# Patient Record
Sex: Male | Born: 1935 | Race: White | Hispanic: No | Marital: Married | Smoking: Never smoker
Health system: Southern US, Community
[De-identification: ages and names within clinical notes are randomized; demographics above are authoritative.]

## PROBLEM LIST (undated history)

## (undated) DIAGNOSIS — E039 Hypothyroidism, unspecified: Secondary | ICD-10-CM

## (undated) DIAGNOSIS — I10 Essential (primary) hypertension: Secondary | ICD-10-CM

## (undated) DIAGNOSIS — K5790 Diverticulosis of intestine, part unspecified, without perforation or abscess without bleeding: Secondary | ICD-10-CM

## (undated) DIAGNOSIS — H409 Unspecified glaucoma: Secondary | ICD-10-CM

## (undated) DIAGNOSIS — M1990 Unspecified osteoarthritis, unspecified site: Secondary | ICD-10-CM

## (undated) HISTORY — DX: Essential (primary) hypertension: I10

## (undated) HISTORY — DX: Diverticulosis of intestine, part unspecified, without perforation or abscess without bleeding: K57.90

## (undated) HISTORY — DX: Hypothyroidism, unspecified: E03.9

## (undated) HISTORY — PX: BACK SURGERY: SHX140

## (undated) HISTORY — DX: Unspecified glaucoma: H40.9

## (undated) HISTORY — DX: Unspecified osteoarthritis, unspecified site: M19.90

---

## 2016-06-13 ENCOUNTER — Telehealth: Payer: Self-pay

## 2016-06-13 NOTE — Telephone Encounter (Signed)
Frank Shipper, MD  Frank Huxley, RN Cc: Frank Wood, CMA        Frank Wood,  Please add on patient Frank Wood date of birth 1932/09/23. Dr. Wilhemina Bonito' father-in-law. From [place]. Reason for the evaluation is dyspepsia with anorexia and weight loss. Have him show up at 11 AM. His contact is his daughter Frank Wood. Please contact her at [phone]. Thank you    Pt scheduled to see Dr. Henrene Pastor 06/14/16'@11'$ :15am. Pts daughter aware of appt date and time.

## 2016-06-14 ENCOUNTER — Encounter (INDEPENDENT_AMBULATORY_CARE_PROVIDER_SITE_OTHER): Payer: Self-pay

## 2016-06-14 ENCOUNTER — Other Ambulatory Visit (INDEPENDENT_AMBULATORY_CARE_PROVIDER_SITE_OTHER): Payer: Medicare Other

## 2016-06-14 ENCOUNTER — Ambulatory Visit (INDEPENDENT_AMBULATORY_CARE_PROVIDER_SITE_OTHER): Payer: Medicare Other | Admitting: Internal Medicine

## 2016-06-14 ENCOUNTER — Encounter: Payer: Self-pay | Admitting: Internal Medicine

## 2016-06-14 VITALS — BP 130/78 | HR 80 | Ht 71.0 in | Wt 176.0 lb

## 2016-06-14 DIAGNOSIS — R140 Abdominal distension (gaseous): Secondary | ICD-10-CM

## 2016-06-14 DIAGNOSIS — R634 Abnormal weight loss: Secondary | ICD-10-CM

## 2016-06-14 DIAGNOSIS — R630 Anorexia: Secondary | ICD-10-CM | POA: Diagnosis not present

## 2016-06-14 DIAGNOSIS — R109 Unspecified abdominal pain: Secondary | ICD-10-CM | POA: Diagnosis not present

## 2016-06-14 LAB — BASIC METABOLIC PANEL
BUN: 28 mg/dL — ABNORMAL HIGH (ref 6–23)
CHLORIDE: 102 meq/L (ref 96–112)
CO2: 27 meq/L (ref 19–32)
CREATININE: 1.1 mg/dL (ref 0.40–1.50)
Calcium: 9.9 mg/dL (ref 8.4–10.5)
GFR: 68.16 mL/min (ref >60.00)
Glucose, Bld: 103 mg/dL — ABNORMAL HIGH (ref 70–99)
POTASSIUM: 3.9 meq/L (ref 3.5–5.1)
SODIUM: 140 meq/L (ref 135–145)

## 2016-06-14 LAB — CBC WITH DIFFERENTIAL/PLATELET
BASOS PCT: 0.4 % (ref 0.0–3.0)
Basophils Absolute: 0 10*3/uL (ref 0.0–0.1)
EOS ABS: 0.1 10*3/uL (ref 0.0–0.7)
EOS PCT: 1.1 % (ref 0.0–5.0)
HEMATOCRIT: 41.4 % (ref 39.0–52.0)
HEMOGLOBIN: 14 g/dL (ref 13.0–17.0)
LYMPHS PCT: 26.9 % (ref 12.0–46.0)
Lymphs Abs: 2 10*3/uL (ref 0.7–4.0)
MCHC: 33.7 g/dL (ref 30.0–36.0)
MCV: 87.7 fl (ref 78.0–100.0)
MONOS PCT: 7.6 % (ref 3.0–12.0)
Monocytes Absolute: 0.6 10*3/uL (ref 0.1–1.0)
Neutro Abs: 4.7 10*3/uL (ref 1.4–7.7)
Neutrophils Relative %: 64 % (ref 43.0–77.0)
Platelets: 240 10*3/uL (ref 150.0–400.0)
RBC: 4.72 Mil/uL (ref 4.22–5.81)
RDW: 13.6 % (ref 11.5–15.5)
WBC: 7.3 10*3/uL (ref 4.0–10.5)

## 2016-06-14 MED ORDER — NA SULFATE-K SULFATE-MG SULF 17.5-3.13-1.6 GM/177ML PO SOLN: 1.0000 | Freq: Once | ORAL | 0 refills | Status: AC

## 2016-06-14 NOTE — Progress Notes (Signed)
HISTORY OF PRESENT ILLNESS:  Frank Wood is a 82 y.o. male , [place] native and father of Frank Wood, who is self-referred with chief complaints of abdominal bloating, fatigue, decreased appetite, and weight loss. He is accompanied by his wife and daughter Frank Wood. The patient reports being in his usual state of health until approximately 1 month ago when he began to notice borborygmi and bloating postprandially. He also reported increased intestinal gas. Associated with this was nausea without vomiting, decreased appetite, and 12 pound weight loss. He denies pyrosis, dysphagia, true abdominal pain, or change in bowel habits. Last colonoscopy approximately 10 years ago with diverticulosis. He does take meloxicam on a regular basis and has done so for several years. He has been anxious and has chronic insomnia. He is concerned about how he is feeling. He does have hypothyroidism and is on replacement. They tell me that his thyroid studies about 3 weeks ago were normal. His PCP was recommended GI evaluation but this could not be scheduled until December. He presents now. Patient did have a significant acute diarrheal illness in June that required urgent care evaluation and hydration. Week or so the fill well then he returned to his baseline. Last week his PCP placed him on pantoprazole 40 mg daily with no change in how he has felt. He was started on Lexapro 10 mg daily just yesterday. No family history of gastrointestinal malignancy. His only surgery was back surgery proximal me 4 years ago  REVIEW OF SYSTEMS:  All non-GI ROS negative except for arthritis, back pain, hearing problems, sleeping problems  Past Medical History:  Diagnosis Date  . Arthritis   . Diverticulosis   . Glaucoma   . Hypertension   . Hypothyroidism     Past Surgical History:  Procedure Laterality Date  . BACK SURGERY      Social History Frank Wood  reports that he has never smoked. He has never used  smokeless tobacco. He reports that he does not drink alcohol or use drugs.  family history includes Diabetes in his mother and sister; Prostate cancer in his brother.  No Known Allergies     PHYSICAL EXAMINATION: Vital signs: BP 130/78 (BP Location: Right Arm, Patient Position: Sitting, Cuff Size: Normal)   Pulse 80   Ht '5\' 11"'$  (1.803 m)   Wt 176 lb (79.8 kg)   BMI 24.55 kg/m   Constitutional: Pleasant, generally well-appearing, no acute distress Psychiatric: alert and oriented x3, cooperative. Somewhat worried appearing Eyes: extraocular movements intact, anicteric, conjunctiva pink Mouth: oral pharynx moist, no lesions Neck: supple without thyromegaly Lymph: no lymphadenopathy Cardiovascular: heart regular rate and rhythm, no murmur Lungs: clear to auscultation bilaterally Abdomen: soft, nontender, nondistended, no obvious ascites, no peritoneal signs, normal bowel sounds, no organomegaly Rectal: Deferred until colonoscopy Extremities: no clubbing cyanosis or lower extremity edema bilaterally Skin: no lesions on visible extremities Neuro: No focal deficits. Normal DTRs. No asterixis.  ASSESSMENT:  #76. 82 year old presents with 1 month history of dyspepsia, abdominal bloating discomfort, fatigue, decreased appetite, and 12 pound weight loss. Possible etiologies include organic entities such as pancreatic cancer, gallbladder disease, ulcer disease, or occult malignancy otherwise. As well, motility disturbance. Finally, functional entities such as depression in the elderly.  PLAN:  #1. Laboratories today including CBC and comprehensive metabolic panel. At the request we will contact Frank Wood with the results. [phone] #2. Contrast-enhanced CT scan of the abdomen and pelvis to evaluate abdominal discomfort and weight loss #3. Continue PPI #4. Continue  Lexapro #5. Schedule colonoscopy and upper endoscopy to evaluate abdominal complaints and weight loss.The nature of the  procedure, as well as the risks, benefits, and alternatives were carefully and thoroughly reviewed with the patient. Ample time for discussion and questions allowed. The patient understood, was satisfied, and agreed to proceed.

## 2016-06-14 NOTE — Patient Instructions (Addendum)
Your physician has requested that you go to the basement for the following lab work before leaving today:  BMET, CBC  You have been scheduled for a CT scan of the abdomen and pelvis at [facility] ([address]---this is in the same building as [facility]).   You are scheduled on 06/16/2016 at 10:30am. You should arrive 15 minutes prior to your appointment time for registration. Please follow the written instructions below on the day of your exam:  WARNING: IF YOU ARE ALLERGIC TO IODINE/X-RAY DYE, PLEASE NOTIFY RADIOLOGY IMMEDIATELY AT [phone]! YOU WILL BE GIVEN A 13 HOUR PREMEDICATION PREP.  1) Do not eat or drink anything after 6:30am (4 hours prior to your test) 2) You have been given 2 bottles of oral contrast to drink. The solution may taste               better if refrigerated, but do NOT add ice or any other liquid to this solution. Shake             well before drinking.    Drink 1 bottle of contrast @ 8:30am (2 hours prior to your exam)  Drink 1 bottle of contrast @ 9:30am (1 hour prior to your exam)  You may take any medications as prescribed with a small amount of water except for the following: Metformin, Glucophage, Glucovance, Avandamet, Riomet, Fortamet, Actoplus Met, Janumet, Glumetza or Metaglip. The above medications must be held the day of the exam AND 48 hours after the exam.  The purpose of you drinking the oral contrast is to aid in the visualization of your intestinal tract. The contrast solution may cause some diarrhea. Before your exam is started, you will be given a small amount of fluid to drink. Depending on your individual set of symptoms, you may also receive an intravenous injection of x-ray contrast/dye. Plan on being at [facility] for 30 minutes or long, depending on the type of exam you are having performed.  If you have any questions regarding your exam or if you need to reschedule, you may call the CT department at [phone]  between the hours of 8:00 am and 5:00 pm, Monday-Friday.   You have been scheduled for an endoscopy and colonoscopy. Please follow the written instructions given to you at your visit today. Please pick up your prep supplies at the pharmacy within the next 1-3 days. If you use inhalers (even only as needed), please bring them with you on the day of your procedure. Your physician has requested that you go to www.startemmi.com and enter the access code given to you at your visit today. This web site gives a general overview about your procedure. However, you should still follow specific instructions given to you by our office regarding your preparation for the procedure.  ________________________________________________________________________

## 2016-06-16 ENCOUNTER — Encounter (HOSPITAL_COMMUNITY): Payer: Self-pay

## 2016-06-16 ENCOUNTER — Ambulatory Visit (HOSPITAL_COMMUNITY)
Admission: RE | Admit: 2016-06-16 | Discharge: 2016-06-16 | Disposition: A | Discharge status: Home / Self Care | Payer: Medicare Other | Source: Ambulatory Visit | Attending: Internal Medicine | Admitting: Internal Medicine

## 2016-06-16 DIAGNOSIS — R634 Abnormal weight loss: Secondary | ICD-10-CM | POA: Diagnosis not present

## 2016-06-16 DIAGNOSIS — K5730 Diverticulosis of large intestine without perforation or abscess without bleeding: Secondary | ICD-10-CM | POA: Insufficient documentation

## 2016-06-16 DIAGNOSIS — N281 Cyst of kidney, acquired: Secondary | ICD-10-CM | POA: Diagnosis not present

## 2016-06-16 DIAGNOSIS — I700 Atherosclerosis of aorta: Secondary | ICD-10-CM | POA: Diagnosis not present

## 2016-06-16 DIAGNOSIS — R140 Abdominal distension (gaseous): Secondary | ICD-10-CM | POA: Diagnosis not present

## 2016-06-16 MED ORDER — IOPAMIDOL (ISOVUE-300) INJECTION 61%
100.0000 mL | Freq: Once | INTRAVENOUS | Status: AC | PRN
  Administered 2016-06-16: 100 mL via INTRAVENOUS

## 2016-06-19 ENCOUNTER — Ambulatory Visit (AMBULATORY_SURGERY_CENTER): Payer: Medicare Other | Admitting: Internal Medicine

## 2016-06-19 ENCOUNTER — Encounter: Payer: Self-pay | Admitting: Internal Medicine

## 2016-06-19 VITALS — BP 131/53 | HR 61 | Temp 98.6°F | Resp 17 | Ht 71.0 in | Wt 176.0 lb

## 2016-06-19 DIAGNOSIS — K635 Polyp of colon: Secondary | ICD-10-CM

## 2016-06-19 DIAGNOSIS — R634 Abnormal weight loss: Secondary | ICD-10-CM | POA: Diagnosis not present

## 2016-06-19 DIAGNOSIS — Z1211 Encounter for screening for malignant neoplasm of colon: Secondary | ICD-10-CM | POA: Diagnosis not present

## 2016-06-19 DIAGNOSIS — R630 Anorexia: Secondary | ICD-10-CM | POA: Diagnosis not present

## 2016-06-19 DIAGNOSIS — R140 Abdominal distension (gaseous): Secondary | ICD-10-CM

## 2016-06-19 DIAGNOSIS — Z1212 Encounter for screening for malignant neoplasm of rectum: Secondary | ICD-10-CM

## 2016-06-19 DIAGNOSIS — D125 Benign neoplasm of sigmoid colon: Secondary | ICD-10-CM

## 2016-06-19 MED ORDER — SODIUM CHLORIDE 0.9 % IV SOLN: 500.0000 mL | INTRAVENOUS | Status: DC

## 2016-06-19 NOTE — Op Note (Signed)
[facility] Patient Name: Frank Wood Procedure Date: 06/19/2016 11:06 AM MRN: [id number] Endoscopist: Docia Chuck. Henrene Pastor , MD Age: 82 Referring MD:  Date of Birth: 1933/01/19 Gender: Male Account #: [id number] Procedure:                Upper GI endoscopy Indications:              Anorexia, Early satiety, Weight loss Medicines:                Monitored Anesthesia Care Procedure:                Pre-Anesthesia Assessment:                           - Prior to the procedure, a History and Physical                            was performed, and patient medications and                            allergies were reviewed. The patient's tolerance of                            previous anesthesia was also reviewed. The risks                            and benefits of the procedure and the sedation                            options and risks were discussed with the patient.                            All questions were answered, and informed consent                            was obtained. Prior Anticoagulants: The patient has                            taken no previous anticoagulant or antiplatelet                            agents. ASA Grade Assessment: II - A patient with                            mild systemic disease. After reviewing the risks                            and benefits, the patient was deemed in                            satisfactory condition to undergo the procedure.                           After obtaining informed consent, the endoscope was  passed under direct vision. Throughout the                            procedure, the patient's blood pressure, pulse, and                            oxygen saturations were monitored continuously. The                            Model GIF-[id number]) scope was introduced                            through the mouth, and advanced to the second part                            of duodenum. The  upper GI endoscopy was                            accomplished without difficulty. The patient                            tolerated the procedure well. Scope In: Scope Out: Findings:                 The esophagus was normal.                           The stomach was normal.                           The examined duodenum was normal.                           The cardia and gastric fundus were normal on                            retroflexion. Complications:            No immediate complications. Estimated Blood Loss:     Estimated blood loss: none. Impression:               - Normal EGD                           - Suspect GI symptoms are secondary to                            anxiety/depression. Recommendation:           - Continue to work with your primary care provider                            regarding treatment of anxiety/depression. As this                            improves, I expect your GI symptoms to resolve.                           -  Resume previous diet.                           - Continue present medications. Docia Chuck. Henrene Pastor, MD 06/19/2016 11:41:39 AM This report has been signed electronically.

## 2016-06-19 NOTE — Op Note (Signed)
[facility] Patient Name: Frank Wood Procedure Date: 06/19/2016 11:06 AM MRN: [id number] Endoscopist: Docia Chuck. Henrene Pastor , MD Age: 82 Referring MD:  Date of Birth: 02/12/1933 Gender: Male Account #: [id number] Procedure:                Colonoscopy, with cold snare polypectomy x 1 Indications:              Screening for colorectal malignant neoplasm Medicines:                Monitored Anesthesia Care Procedure:                Pre-Anesthesia Assessment:                           - Prior to the procedure, a History and Physical                            was performed, and patient medications and                            allergies were reviewed. The patient's tolerance of                            previous anesthesia was also reviewed. The risks                            and benefits of the procedure and the sedation                            options and risks were discussed with the patient.                            All questions were answered, and informed consent                            was obtained. Prior Anticoagulants: The patient has                            taken no previous anticoagulant or antiplatelet                            agents. ASA Grade Assessment: II - A patient with                            mild systemic disease. After reviewing the risks                            and benefits, the patient was deemed in                            satisfactory condition to undergo the procedure.                           After obtaining informed consent, the colonoscope  was passed under direct vision. Throughout the                            procedure, the patient's blood pressure, pulse, and                            oxygen saturations were monitored continuously. The                            Model CF-[id number]) scope was introduced                            through the anus and advanced to the the cecum,              identified by appendiceal orifice and ileocecal                            valve. The ileocecal valve, appendiceal orifice,                            and rectum were photographed. The quality of the                            bowel preparation was excellent. The colonoscopy                            was performed without difficulty. The patient                            tolerated the procedure well. The bowel preparation                            used was SUPREP. Scope In: 11:16:43 AM Scope Out: 11:27:48 AM Scope Withdrawal Time: 0 hours 9 minutes 19 seconds  Total Procedure Duration: 0 hours 11 minutes 5 seconds  Findings:                 A 5 mm polyp was found in the sigmoid colon. The                            polyp was removed with a cold snare. Resection and                            retrieval were complete.                           Multiple diverticula were found in the left colon                            and right colon.                           The exam was otherwise without abnormality on  direct and retroflexion views. Complications:            No immediate complications. Estimated blood loss:                            None. Estimated Blood Loss:     Estimated blood loss: none. Impression:               - One 5 mm polyp in the sigmoid colon, removed with                            a cold snare. Resected and retrieved.                           - Diverticulosis in the left colon and in the right                            colon.                           - The examination was otherwise normal on direct                            and retroflexion views. Recommendation:           - Repeat colonoscopy is not recommended for                            surveillance, given the favorable findings and your                            current age.                           - EGD today. Please see report for findings and                             final recommendations.                           - Await pathology results. Docia Chuck. Henrene Pastor, MD 06/19/2016 11:38:19 AM This report has been signed electronically.

## 2016-06-19 NOTE — Progress Notes (Signed)
Called to room to assist during endoscopic procedure.  Patient ID and intended procedure confirmed with present staff. Received instructions for my participation in the procedure from the performing physician.  

## 2016-06-19 NOTE — Progress Notes (Signed)
Report given to PACU RN, vss 

## 2016-06-19 NOTE — Patient Instructions (Signed)
Discharge instructions given. Handouts on polyps and diverticulosis. Resume previous medications. YOU HAD AN ENDOSCOPIC PROCEDURE TODAY AT [facility]:   Refer to the procedure report that was given to you for any specific questions about what was found during the examination.  If the procedure report does not answer your questions, please call your gastroenterologist to clarify.  If you requested that your care partner not be given the details of your procedure findings, then the procedure report has been included in a sealed envelope for you to review at your convenience later.  YOU SHOULD EXPECT: Some feelings of bloating in the abdomen. Passage of more gas than usual.  Walking can help get rid of the air that was put into your GI tract during the procedure and reduce the bloating. If you had a lower endoscopy (such as a colonoscopy or flexible sigmoidoscopy) you may notice spotting of blood in your stool or on the toilet paper. If you underwent a bowel prep for your procedure, you may not have a normal bowel movement for a few days.  Please Note:  You might notice some irritation and congestion in your nose or some drainage.  This is from the oxygen used during your procedure.  There is no need for concern and it should clear up in a day or so.  SYMPTOMS TO REPORT IMMEDIATELY:   Following lower endoscopy (colonoscopy or flexible sigmoidoscopy):  Excessive amounts of blood in the stool  Significant tenderness or worsening of abdominal pains  Swelling of the abdomen that is new, acute  Fever of 100F or higher   Following upper endoscopy (EGD)  Vomiting of blood or coffee ground material  New chest pain or pain under the shoulder blades  Painful or persistently difficult swallowing  New shortness of breath  Fever of 100F or higher  Black, tarry-looking stools  For urgent or emergent issues, a gastroenterologist can be reached at any hour by calling [phone]  [phone].   DIET:  We do recommend a small meal at first, but then you may proceed to your regular diet.  Drink plenty of fluids but you should avoid alcoholic beverages for 24 hours.  ACTIVITY:  You should plan to take it easy for the rest of today and you should NOT DRIVE or use heavy machinery until tomorrow (because of the sedation medicines used during the test).    FOLLOW UP: Our staff will call the number listed on your records the next business day following your procedure to check on you and address any questions or concerns that you may have regarding the information given to you following your procedure. If we do not reach you, we will leave a message.  However, if you are feeling well and you are not experiencing any problems, there is no need to return our call.  We will assume that you have returned to your regular daily activities without incident.  If any biopsies were taken you will be contacted by phone or by letter within the next 1-3 weeks.  Please call us at [phone] if you have not heard about the biopsies in 3 weeks.    SIGNATURES/CONFIDENTIALITY: You and/or your care partner have signed paperwork which will be entered into your electronic medical record.  These signatures attest to the fact that that the information above on your After Visit Summary has been reviewed and is understood.  Full responsibility of the confidentiality of this discharge information lies with you and/or your care-partner.

## 2016-06-20 ENCOUNTER — Telehealth: Payer: Self-pay

## 2016-06-20 ENCOUNTER — Telehealth: Payer: Self-pay | Admitting: *Deleted

## 2016-06-20 NOTE — Telephone Encounter (Signed)
  Follow up Call-  Call back number 06/19/2016  Post procedure Call Back phone  # [phone]  Permission to leave phone message Yes     Patient questions:  Do you have a fever, pain , or abdominal swelling? No. Pain Score  0 *  Have you tolerated food without any problems? Yes.    Have you been able to return to your normal activities? Yes.    Do you have any questions about your discharge instructions: Diet   No. Medications  No. Follow up visit  No.  Do you have questions or concerns about your Care? No.  Actions: * If pain score is 4 or above: No action needed, pain <4.

## 2016-06-20 NOTE — Telephone Encounter (Signed)
  Follow up Call-  Call back number 06/19/2016  Post procedure Call Back phone  # [phone]  Permission to leave phone message Yes     Patient questions:  Message left to call us if necessary.

## 2016-06-22 ENCOUNTER — Encounter: Payer: Self-pay | Admitting: Internal Medicine

## 2016-08-25 ENCOUNTER — Telehealth: Payer: Self-pay | Admitting: Internal Medicine

## 2016-09-05 NOTE — Telephone Encounter (Signed)
lm

## 2016-09-07 ENCOUNTER — Telehealth: Payer: Self-pay | Admitting: Internal Medicine

## 2016-09-20 NOTE — Telephone Encounter (Signed)
Faxed primary diagnosis for office visit 06/14/2016 of Anorexia/weight loss  ICD-10-DM R63.4

## 2017-12-21 ENCOUNTER — Telehealth: Payer: Self-pay | Admitting: *Deleted

## 2017-12-21 ENCOUNTER — Encounter: Payer: Self-pay | Admitting: Radiation Oncology

## 2017-12-21 DIAGNOSIS — R918 Other nonspecific abnormal finding of lung field: Secondary | ICD-10-CM

## 2017-12-21 NOTE — Telephone Encounter (Signed)
Oncology Nurse Navigator Documentation  Oncology Nurse Navigator Flowsheets 12/21/2017  Navigator Location [facility]  Navigator Encounter Type Telephone/I called patient's PCP in [place] and his office is closed on Friday's.  I called patient's wife back. I instructed her to obtain CD of patient's most recent scans. She verbalized understanding and will do this on Monday.   Treatment Phase Pre-Tx/Tx Discussion  Barriers/Navigation Needs Education  Education Other  Interventions Education  Education Method Verbal  Acuity Level 2  Time Spent with Patient 30

## 2017-12-21 NOTE — Progress Notes (Signed)
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 The patient's son in law approached me about getting a second opinion here at [facility] and potentially getting care here.  I suggested he send me the records and I will coordinate [facility] evaluation/visit.

## 2017-12-21 NOTE — Telephone Encounter (Signed)
Oncology Nurse Navigator Documentation  Oncology Nurse Navigator Flowsheets 12/21/2017  Navigator Location [facility]  Referral date to RadOnc/MedOnc 12/21/2017  Navigator Encounter Type Telephone;Other/I received referral from Dr. Tammi Klippel today.  I called patient and spoke with wife. I gave her an appt to be seen at [facility] next week.  I also obtained records from Dr. Johny Shears office.  I will call [place] to obtain scan and pathology.    Telephone Outgoing Call  Treatment Phase Pre-Tx/Tx Discussion  Barriers/Navigation Needs Coordination of Care  Interventions Coordination of Care  Coordination of Care Appts;Other  Acuity Level 2  Time Spent with Patient 45

## 2017-12-24 ENCOUNTER — Encounter: Payer: Self-pay | Admitting: *Deleted

## 2017-12-24 NOTE — Progress Notes (Signed)
Oncology Nurse Navigator Documentation  Oncology Nurse Navigator Flowsheets 12/24/2017  Navigator Location [facility]  Navigator Encounter Type Telephone/I called patient's PCP Dr. Azzie Almas in [place] to obtain records.  I was updated that their fax machine is not working but it is being worked on. If they are unable to fax today, they will give records to family this Tuesday.    Telephone Outgoing Call  Treatment Phase Pre-Tx/Tx Discussion  Barriers/Navigation Needs Coordination of Care  Interventions Coordination of Care  Coordination of Care Other  Acuity Level 1  Time Spent with Patient 30

## 2017-12-26 ENCOUNTER — Telehealth: Payer: Self-pay | Admitting: *Deleted

## 2017-12-26 ENCOUNTER — Ambulatory Visit
Admission: RE | Admit: 2017-12-26 | Discharge: 2017-12-26 | Disposition: A | Discharge status: Home / Self Care | Payer: Self-pay | Source: Ambulatory Visit | Attending: Radiation Oncology | Admitting: Radiation Oncology

## 2017-12-26 ENCOUNTER — Other Ambulatory Visit: Payer: Self-pay | Admitting: Radiation Oncology

## 2017-12-26 ENCOUNTER — Telehealth: Payer: Self-pay | Admitting: Radiation Oncology

## 2017-12-26 DIAGNOSIS — C3490 Malignant neoplasm of unspecified part of unspecified bronchus or lung: Secondary | ICD-10-CM

## 2017-12-26 NOTE — Telephone Encounter (Signed)
I spoke with the patient's daughter to discuss the findings thusfar and need for additional work up. We will proceed with MRI brain and PET scan orders and discuss in detail the plan to proceed with treament tomorrow at lung cancer clinic.

## 2017-12-26 NOTE — Addendum Note (Signed)
Addended by: Carola Rhine on: 12/26/2017 11:53 AM   Modules accepted: Orders

## 2017-12-26 NOTE — Telephone Encounter (Signed)
Called patient to inform of Pet and MRI on 01-03-18 @ [facility], lvm for a return call

## 2017-12-27 ENCOUNTER — Other Ambulatory Visit: Payer: Self-pay

## 2017-12-27 ENCOUNTER — Ambulatory Visit: Payer: Medicare Other | Attending: Internal Medicine | Admitting: Physical Therapy

## 2017-12-27 ENCOUNTER — Encounter: Payer: Self-pay | Admitting: *Deleted

## 2017-12-27 ENCOUNTER — Telehealth: Payer: Self-pay | Admitting: Internal Medicine

## 2017-12-27 ENCOUNTER — Inpatient Hospital Stay: Payer: Medicare Other

## 2017-12-27 ENCOUNTER — Ambulatory Visit
Admission: RE | Admit: 2017-12-27 | Discharge: 2017-12-27 | Disposition: A | Discharge status: Home / Self Care | Payer: Medicare Other | Source: Ambulatory Visit | Attending: Radiation Oncology | Admitting: Radiation Oncology

## 2017-12-27 ENCOUNTER — Inpatient Hospital Stay: Payer: Medicare Other | Attending: Internal Medicine | Admitting: Internal Medicine

## 2017-12-27 DIAGNOSIS — R918 Other nonspecific abnormal finding of lung field: Secondary | ICD-10-CM

## 2017-12-27 DIAGNOSIS — R2681 Unsteadiness on feet: Secondary | ICD-10-CM | POA: Insufficient documentation

## 2017-12-27 DIAGNOSIS — Z8042 Family history of malignant neoplasm of prostate: Secondary | ICD-10-CM | POA: Diagnosis not present

## 2017-12-27 DIAGNOSIS — C3410 Malignant neoplasm of upper lobe, unspecified bronchus or lung: Secondary | ICD-10-CM | POA: Insufficient documentation

## 2017-12-27 DIAGNOSIS — R29898 Other symptoms and signs involving the musculoskeletal system: Secondary | ICD-10-CM | POA: Diagnosis present

## 2017-12-27 DIAGNOSIS — Z800 Family history of malignant neoplasm of digestive organs: Secondary | ICD-10-CM | POA: Insufficient documentation

## 2017-12-27 DIAGNOSIS — I10 Essential (primary) hypertension: Secondary | ICD-10-CM | POA: Insufficient documentation

## 2017-12-27 DIAGNOSIS — H409 Unspecified glaucoma: Secondary | ICD-10-CM | POA: Diagnosis not present

## 2017-12-27 DIAGNOSIS — C3412 Malignant neoplasm of upper lobe, left bronchus or lung: Secondary | ICD-10-CM

## 2017-12-27 DIAGNOSIS — C3492 Malignant neoplasm of unspecified part of left bronchus or lung: Secondary | ICD-10-CM

## 2017-12-27 DIAGNOSIS — R2689 Other abnormalities of gait and mobility: Secondary | ICD-10-CM | POA: Diagnosis present

## 2017-12-27 DIAGNOSIS — E039 Hypothyroidism, unspecified: Secondary | ICD-10-CM | POA: Diagnosis not present

## 2017-12-27 DIAGNOSIS — Z5111 Encounter for antineoplastic chemotherapy: Secondary | ICD-10-CM | POA: Insufficient documentation

## 2017-12-27 DIAGNOSIS — Z79899 Other long term (current) drug therapy: Secondary | ICD-10-CM | POA: Insufficient documentation

## 2017-12-27 DIAGNOSIS — M1990 Unspecified osteoarthritis, unspecified site: Secondary | ICD-10-CM | POA: Diagnosis not present

## 2017-12-27 DIAGNOSIS — Z7189 Other specified counseling: Secondary | ICD-10-CM | POA: Insufficient documentation

## 2017-12-27 LAB — CBC WITH DIFFERENTIAL (CANCER CENTER ONLY)
Basophils Absolute: 0 10*3/uL (ref 0.0–0.1)
Basophils Relative: 0 %
Eosinophils Absolute: 0.1 10*3/uL (ref 0.0–0.5)
Eosinophils Relative: 1 %
HEMATOCRIT: 37.8 % — AB (ref 38.4–49.9)
HEMOGLOBIN: 12.2 g/dL — AB (ref 13.0–17.1)
LYMPHS ABS: 1.6 10*3/uL (ref 0.9–3.3)
Lymphocytes Relative: 21 %
MCH: 28.9 pg (ref 27.2–33.4)
MCHC: 32.4 g/dL (ref 32.0–36.0)
MCV: 89.3 fL (ref 79.3–98.0)
Monocytes Absolute: 0.6 10*3/uL (ref 0.1–0.9)
Monocytes Relative: 8 %
NEUTROS ABS: 5.6 10*3/uL (ref 1.5–6.5)
NEUTROS PCT: 70 %
Platelet Count: 272 10*3/uL (ref 140–400)
RBC: 4.23 MIL/uL (ref 4.20–5.82)
RDW: 13.5 % (ref 11.0–14.6)
WBC Count: 7.9 10*3/uL (ref 4.0–10.3)

## 2017-12-27 LAB — CMP (CANCER CENTER ONLY)
ALT: 13 U/L (ref 0–55)
ANION GAP: 8 (ref 3–11)
AST: 15 U/L (ref 5–34)
Albumin: 3.8 g/dL (ref 3.5–5.0)
Alkaline Phosphatase: 81 U/L (ref 40–150)
BUN: 18 mg/dL (ref 7–26)
CHLORIDE: 107 mmol/L (ref 98–109)
CO2: 24 mmol/L (ref 22–29)
CREATININE: 1.09 mg/dL (ref 0.70–1.30)
Calcium: 9.4 mg/dL (ref 8.4–10.4)
GFR, Estimated: >60 mL/min (ref >60)
Glucose, Bld: 132 mg/dL (ref 70–140)
Potassium: 4.6 mmol/L (ref 3.5–5.1)
SODIUM: 139 mmol/L (ref 136–145)
Total Bilirubin: 0.4 mg/dL (ref 0.2–1.2)
Total Protein: 7.7 g/dL (ref 6.4–8.3)

## 2017-12-27 MED ORDER — PROCHLORPERAZINE MALEATE 10 MG PO TABS: 10.0000 mg | ORAL_TABLET | Freq: Four times a day (QID) | ORAL | 0 refills | Status: DC | PRN

## 2017-12-27 NOTE — Progress Notes (Signed)
[facility] [place] Clinical Social Work  Clinical Social Work met with patient/family at [employer] appointment to offer support and assess for psychosocial needs.  Patient was accompanied by his spouse and two daughters.  Patient lives in [place], but plans to stay with his daughter through treatment.  Mr. Lyerly has no concerns at this time, patient's family stated he is coping well and generally manages stress well.    Clinical Social Work briefly discussed Clinical Social Work role and [employer] support programs/services.  Clinical Social Work encouraged patient to call with any additional questions or concerns.   Maryjean Morn, MSW, LCSW, OSW-C Clinical Social Worker [facility] [phone]

## 2017-12-27 NOTE — Progress Notes (Signed)
START ON PATHWAY REGIMEN - Non-Small Cell Lung     Administer weekly:     Paclitaxel      Carboplatin   **Always confirm dose/schedule in your pharmacy ordering system**    Patient Characteristics: Stage III - Unresectable, PS = 0, 1 AJCC T Category: T2b Current Disease Status: No Distant Mets or Local Recurrence AJCC N Category: N2 AJCC M Category: M0 AJCC 8 Stage Grouping: IIIA Performance Status: PS = 0, 1 Intent of Therapy: Curative Intent, Discussed with Patient

## 2017-12-27 NOTE — Progress Notes (Signed)
Radiation Oncology         [phone]) [phone] ________________________________  Initial outpatient Consultation  Name: Frank Wood [facility] MRN: [id number]  Date of Service: 12/27/2017 DOB: [birth year]  CN:[id number], Pcp Not In  Frank Sensing, MD   REFERRING PHYSICIAN: Danella Sensing, MD  DIAGNOSIS: 84 y.o. gentleman with stage IIIA, cT2bN2Mx, NSCLC,squamous cell carcinoma of the left upper lobe, pending PET and brain MRI    ICD-10-CM   1. 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 male seen at the request of the patient for a new diagnosis of squamous cell carcinoma of the lung. The patient was seen in [place], [place] where he resides by Dr. Micael Hampshire in pulmonary medicine after a CXR revealed a mass in the LUL. lung on 11/20/17. He does not have CT imaging, but did undergo navigational bronchoscopy on 12/12/17 with Dr. Mia Creek in [place] [facility]. Biopsies of the left upper lobe revealed a squamous cell carcinoma, at 4L node there were a few lymphocytes, and 4R, he has scattered lymphocytes, in the lavage there were no malignant cells, but there were cells consistent with squamous cell carcinoma in the station 7 nodal location. He has decided to relocate care to [place], [place] where his daughter and son in law (Dr. Ronnald Ramp) reside. After speaking with his daughter [name] yesterday, our plan is to proceed with MRI brain and PET scan on 01/03/18. In further discussion, he did have a CT scan in March 2019 that is brought with the patient to clinic that reveals adenopathy in the chest and only a LUL lesion seen as presumed by additional data that had previously been collected. He comes today to discuss treatment recommendations for his cancer.    PREVIOUS RADIATION THERAPY: No  PAST MEDICAL HISTORY:  Past Medical History:  Diagnosis Date  . Arthritis   . Diverticulosis   . Glaucoma   . Hypertension   . Hypothyroidism       PAST  SURGICAL HISTORY: Past Surgical History:  Procedure Laterality Date  . BACK SURGERY      FAMILY HISTORY:  Family History  Problem Relation Age of Onset  . Diabetes Mother   . Diabetes Sister   . Prostate cancer Brother   . Colon cancer Neg Hx   . Stomach cancer Neg Hx   . Esophageal cancer Neg Hx     SOCIAL HISTORY:  Social History   Socioeconomic History  . Marital status: Married    Spouse name: Not on file  . Number of children: Not on file  . Years of education: Not on file  . Highest education level: Not on file  Occupational History  . Not on file  Social Needs  . Financial resource strain: Not on file  . Food insecurity:    Worry: Not on file    Inability: Not on file  . Transportation needs:    Medical: Not on file    Non-medical: Not on file  Tobacco Use  . Smoking status: Never Smoker  . Smokeless tobacco: Never Used  Substance and Sexual Activity  . Alcohol use: No  . Drug use: No  . Sexual activity: Not on file  Lifestyle  . Physical activity:    Days per week: Not on file    Minutes per session: Not on file  . Stress: Not on file  Relationships  . Social connections:    Talks on phone:  Not on file    Gets together: Not on file    Attends religious service: Not on file    Active member of club or organization: Not on file    Attends meetings of clubs or organizations: Not on file    Relationship status: Not on file  . Intimate partner violence:    Fear of current or ex partner: Not on file    Emotionally abused: Not on file    Physically abused: Not on file    Forced sexual activity: Not on file  Other Topics Concern  . Not on file  Social History Narrative  . Not on file  The patient is married and resides in [place], [place]. He is a retired Building control surveyor. He is going to stay in [place] with his daughter an dson in law to proceed with treatment here.  ALLERGIES: Patient has no known allergies.  MEDICATIONS:  Current Outpatient Medications    Medication Sig Dispense Refill  . benazepril (LOTENSIN) 20 MG tablet   1  . benazepril-hydrochlorthiazide (LOTENSIN HCT) 20-25 MG tablet Take 1 tablet by mouth daily.    Marland Kitchen gabapentin (NEURONTIN) 100 MG capsule Take 100 mg by mouth daily.    Marland Kitchen gemfibrozil (LOPID) 600 MG tablet Take 600 mg by mouth daily.    Marland Kitchen levothyroxine (SYNTHROID, LEVOTHROID) 150 MCG tablet   0  . meloxicam (MOBIC) 15 MG tablet Take 15 mg by mouth daily.    . pantoprazole (PROTONIX) 40 MG tablet Take 40 mg by mouth daily.    . prochlorperazine (COMPAZINE) 10 MG tablet Take 1 tablet (10 mg total) by mouth every 6 (six) hours as needed for nausea or vomiting. 30 tablet 0  . tamsulosin (FLOMAX) 0.4 MG CAPS capsule Take 0.4 mg by mouth.    . zolpidem (AMBIEN) 10 MG tablet Take 10 mg by mouth at bedtime as needed for sleep.     Current Facility-Administered Medications  Medication Dose Route Frequency Provider Last Rate Last Dose  . 0.9 %  sodium chloride infusion  500 mL Intravenous Continuous Irene Shipper, MD        REVIEW OF SYSTEMS:  On review of systems, the patient reports that he is doing well overall. He denies any chest pain, shortness of breath, cough, fevers, chills, night sweats, unintended weight changes. He denies any bowel or bladder disturbances, and denies abdominal pain, nausea or vomiting. He denies any new musculoskeletal or joint aches or pains. A complete review of systems is obtained and is otherwise negative.    PHYSICAL EXAM:  Wt Readings from Last 3 Encounters:  12/27/17 190 lb (86.2 kg)  06/19/16 176 lb (79.8 kg)  06/14/16 176 lb (79.8 kg)   Temp Readings from Last 3 Encounters:  12/27/17 97.7 F (36.5 C) (Oral)  06/19/16 98.6 F (37 C) (Temporal)   BP Readings from Last 3 Encounters:  12/27/17 (!) 160/72  06/19/16 (!) 131/53  06/14/16 130/78   Pulse Readings from Last 3 Encounters:  12/27/17 95  06/19/16 61  06/14/16 80    In general this is a well appearing caucasian male in no  acute distress. He is alert and oriented x4 and appropriate throughout the examination. HEENT reveals that the patient is normocephalic, atraumatic. EOMs are intact. PERRLA. Skin is intact without any evidence of gross lesions. Cardiovascular exam reveals a regular rate and rhythm, no clicks rubs or murmurs are auscultated. Chest is clear to auscultation bilaterally. Lymphatic assessment is performed and does not reveal any  adenopathy in the cervical, supraclavicular, axillary, or inguinal chains. Abdomen has active bowel sounds in all quadrants and is intact. The abdomen is soft, non tender, non distended. Lower extremities are negative for pretibial pitting edema, deep calf tenderness, cyanosis or clubbing.   KPS = 100  100 - Normal; no complaints; no evidence of disease. 90   - Able to carry on normal activity; minor signs or symptoms of disease. 80   - Normal activity with effort; some signs or symptoms of disease. 103   - Cares for self; unable to carry on normal activity or to do active work. 60   - Requires occasional assistance, but is able to care for most of his personal needs. 50   - Requires considerable assistance and frequent medical care. 47   - Disabled; requires special care and assistance. 5   - Severely disabled; hospital admission is indicated although death not imminent. 84   - Very sick; hospital admission necessary; active supportive treatment necessary. 10   - Moribund; fatal processes progressing rapidly. 0     - Dead  Karnofsky DA, Abelmann [place], Craver LS and Burchenal [facility] [phone]) The use of the nitrogen mustards in the palliative treatment of carcinoma: with particular reference to bronchogenic carcinoma Cancer 1 [id number]  LABORATORY DATA:  Lab Results  Component Value Date   WBC 7.9 12/27/2017   HGB 12.2 (L) 12/27/2017   HCT 37.8 (L) 12/27/2017   MCV 89.3 12/27/2017   PLT 272 12/27/2017   Lab Results  Component Value Date   NA 139 12/27/2017   K 4.6 12/27/2017    CL 107 12/27/2017   CO2 24 12/27/2017   Lab Results  Component Value Date   ALT 13 12/27/2017   AST 15 12/27/2017   ALKPHOS 81 12/27/2017   BILITOT 0.4 12/27/2017     RADIOGRAPHY: No results found.    IMPRESSION/PLAN: 1. 84 y.o. gentleman with stage IIIA, cT2bN2Mx, NSCLC,squamous cell carcinoma of the left upper lobe, pending PET and brain MRI. Today we discussed the pathology findings and reviews the nature of NSCLC and would recommend continuing the work up of Brain MRI and PET scan. If his disease is only limited to the chest, he would be a candidate for concurrent chemosensitization with radiotherapy. We discussed the risks, benefits, short, and long term effects of radiotherapy, and the patient is interested in proceeding. We discussed the delivery and logistics of radiotherapy and anticipates a course of 6 1/2 weeks of radiotherapy. He will return for simulation next week, and we anticipate starting treatment the week of 01/08/18.     Carola Rhine, PAC    Tyler Pita, MD  [facility] Direct Dial: [phone]  Fax: [phone] [email or web address]  Skype  [email or web address]

## 2017-12-27 NOTE — Telephone Encounter (Signed)
Scheduled appt per 5/16 los - unable to schedule first two treatments due to capped day - logged - will contact patient when appt is scheduled.

## 2017-12-27 NOTE — Progress Notes (Signed)
[place] Telephone:([phone]) [phone]   Fax:([phone]) [phone] Multidisciplinary thoracic oncology clinic  CONSULT NOTE  REFERRING PHYSICIAN: Dr. Horton Finer, [place], [place]:  84 years old white male recently diagnosed with lung cancer.  HPI Frank Wood is a 84 y.o. male a never smoker with past medical history significant for hypertension, hypothyroidism, osteoarthritis, glaucoma, diverticulosis as well as back surgery.  The patient mentioned that since January 2019 he has been complaining of cough and chest congestion.  He was seen by his primary care physician and treated for acute bronchitis with a course of antibiotics.  He has no improvement in his condition and chest x-ray was performed on November 02, 2017 and that showed suspicious mass in the left lung.  This was followed by CT scan of the chest in 11/20/2017 and that showed an approximately 3.5 x 5.0 cm oval-shaped left suprahilar pulmonary mass.  There was no adjacent left hilar lymphadenopathy and no signs of mediastinal or axillary lymphadenopathy.  The patient was seen by Dr.Shweihat and he underwent bronchoscopy, EBUS as well as electromagnetic navigational bronchoscopy with biopsy of the left upper lobe lung mass and mediastinal lymph nodes.  The final pathology ([id number]) showed neoplastic cells consistent with a squamous cell carcinoma.  There was also evidence of metastatic squamous cell carcinoma in level 7 lymph node.  The patient decided to transfer his care to [facility] to be close to his stepdaughter and son-in-law who is a Paediatric nurse in town. When seen today he is feeling fine with no specific complaints.  He denied having any chest pain, shortness breath, cough or hemoptysis.  He denied having any weight loss or night sweats.  He has no nausea, vomiting, diarrhea or constipation.  He has no headache or visual changes. Family history mother and sister had  diabetes mellitus, 2 brothers had prostate cancer and a sister with a stomach cancer. The patient is married and has 1 daughter and 3 stepdaughters.  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  He was accompanied today by his wife Frank Wood, daughter Frank Wood, stepdaughter Frank Wood and son-in-law Frank Wood. The patient is a never smoker and no history of alcohol or drug abuse.   HPI  Past Medical History:  Diagnosis Date  . Arthritis   . Diverticulosis   . Glaucoma   . Hypertension   . Hypothyroidism     Past Surgical History:  Procedure Laterality Date  . BACK SURGERY      Family History  Problem Relation Age of Onset  . Diabetes Mother   . Diabetes Sister   . Prostate cancer Brother   . Colon cancer Neg Hx   . Stomach cancer Neg Hx   . Esophageal cancer Neg Hx     Social History Social History   Tobacco Use  . Smoking status: Never Smoker  . Smokeless tobacco: Never Used  Substance Use Topics  . Alcohol use: No  . Drug use: No    No Known Allergies  Current Outpatient Medications  Medication Sig Dispense Refill  . benazepril-hydrochlorthiazide (LOTENSIN HCT) 20-25 MG tablet Take 1 tablet by mouth daily.    Marland Kitchen escitalopram (LEXAPRO) 10 MG tablet Take 10 mg by mouth daily.    Marland Kitchen gabapentin (NEURONTIN) 100 MG capsule Take 100 mg by mouth daily.    Marland Kitchen gemfibrozil (LOPID) 600 MG tablet Take 600 mg by mouth daily.    Marland Kitchen levothyroxine (SYNTHROID, LEVOTHROID) 125 MCG tablet  Take 125 mcg by mouth daily before breakfast.    . meloxicam (MOBIC) 15 MG tablet Take 15 mg by mouth daily.    . pantoprazole (PROTONIX) 40 MG tablet Take 40 mg by mouth daily.    . tamsulosin (FLOMAX) 0.4 MG CAPS capsule Take 0.4 mg by mouth.    . zolpidem (AMBIEN) 10 MG tablet Take 10 mg by mouth at bedtime as needed for sleep.     Current Facility-Administered Medications  Medication Dose Route Frequency Provider Last Rate Last Dose  . 0.9 %  sodium chloride infusion  500 mL Intravenous Continuous  Irene Shipper, MD        Review of Systems  Constitutional: negative Eyes: negative Ears, nose, mouth, throat, and face: negative Respiratory: negative Cardiovascular: negative Gastrointestinal: negative Genitourinary:negative Integument/breast: negative Hematologic/lymphatic: negative Musculoskeletal:negative Neurological: negative Behavioral/Psych: negative Endocrine: negative Allergic/Immunologic: negative  Physical Exam  ZES:PQZRA, healthy, no distress, well nourished and well developed SKIN: skin color, texture, turgor are normal, no rashes or significant lesions HEAD: Normocephalic, No masses, lesions, tenderness or abnormalities EYES: normal, PERRLA, Conjunctiva are pink and non-injected EARS: External ears normal, Canals clear OROPHARYNX:no exudate, no erythema and lips, buccal mucosa, and tongue normal  NECK: supple, no adenopathy, no JVD LYMPH:  no palpable lymphadenopathy, no hepatosplenomegaly LUNGS: clear to auscultation , and palpation HEART: regular rate & rhythm, no murmurs and no gallops ABDOMEN:abdomen soft, non-tender, normal bowel sounds and no masses or organomegaly BACK: Back symmetric, no curvature., No CVA tenderness EXTREMITIES:no joint deformities, effusion, or inflammation, no edema, no skin discoloration  NEURO: alert & oriented x 3 with fluent speech, no focal motor/sensory deficits  PERFORMANCE STATUS: ECOG 0  LABORATORY DATA: Lab Results  Component Value Date   WBC 7.9 12/27/2017   HGB 12.2 (L) 12/27/2017   HCT 37.8 (L) 12/27/2017   MCV 89.3 12/27/2017   PLT 272 12/27/2017      Chemistry      Component Value Date/Time   NA 140 06/14/2016 1236   K 3.9 06/14/2016 1236   CL 102 06/14/2016 1236   CO2 27 06/14/2016 1236   BUN 28 (H) 06/14/2016 1236   CREATININE 1.10 06/14/2016 1236      Component Value Date/Time   CALCIUM 9.9 06/14/2016 1236       RADIOGRAPHIC STUDIES: No results found.  ASSESSMENT: This is a very pleasant  84 years old white male recently diagnosed with stage IIIa (T2 a, N2, M0) non-small cell lung cancer, squamous cell carcinoma in a never smoker patient, pending further staging work-up.   PLAN: I had a lengthy discussion with the patient and his family today about his current disease stage, prognosis and treatment options. I personally and independently reviewed his scan images and discussed the results and showed the images to the patient and his family. I would request the pathology slides and tissue block to be reviewed by [facility] pathology to confirm the diagnosis of a squamous cell carcinoma.  The patient has no smoking history and I want to be sure that we are not missing adenocarcinoma in this patient.  His PDL 1 expression was 60%.  I will also request the tissue block to be sent to [facility] for molecular studies because of his non-smoking history. We will also complete the staging work-up by ordering a PET scan as well as MRI of the brain.  These are expected to be performed on Jan 03, 2018. If the patient has no evidence for metastatic disease, he would  be considered for a course of concurrent chemoradiation with weekly carboplatin for AUC of 2 and paclitaxel 45 mg/M2 for 6-7 weeks followed by restaging work-up and consideration of consolidation immunotherapy with Imfinzi (Durvalumab). I discussed with the patient the adverse effect of this treatment including but not limited to alopecia, myelosuppression, nausea and vomiting, peripheral neuropathy, liver or renal dysfunction. The patient will see radiation oncology today for further evaluation and discussion of his radiotherapy option. I will call his pharmacy with prescription for Compazine 10 mg p.o. every 6 hours as needed for nausea. He was seen during the multidisciplinary thoracic oncology clinic today by medical oncology, radiation oncology, and physical therapist. He will come back for follow-up visit in 2 weeks for evaluation  and management of any adverse effect of his treatment. The patient was advised to call immediately if he has any concerning symptoms in the interval. The patient voices understanding of current disease status and treatment options and is in agreement with the current care plan.  All questions were answered. The patient knows to call the clinic with any problems, questions or concerns. We can certainly see the patient much sooner if necessary.  Thank you so much for allowing me to participate in the care of Frank Wood. I will continue to follow up the patient with you and assist in his care.  I spent 55 minutes counseling the patient face to face. The total time spent in the appointment was 80 minutes.  Disclaimer: This note was dictated with voice recognition software. Similar sounding words can inadvertently be transcribed and may not be corrected upon review.   Eilleen Kempf Dec 27, 2017, 12:55 PM

## 2017-12-27 NOTE — Therapy (Signed)
[place], [place], [address] Phone: [phone]   Fax:  [phone]  Physical Therapy Evaluation  Patient Details  Name: Frank Wood MRN: [id number] Date of Birth: 01/01/1933 Referring Provider: Dr. Curt Bears   Encounter Date: 12/27/2017  PT End of Session - 12/27/17 1539    Visit Number  1    Number of Visits  1    PT Start Time  1440    PT Stop Time  1500    PT Time Calculation (min)  20 min    Activity Tolerance  Patient tolerated treatment well    Behavior During Therapy  [facility] for tasks assessed/performed       Past Medical History:  Diagnosis Date  . Arthritis   . Diverticulosis   . Glaucoma   . Hypertension   . Hypothyroidism     Past Surgical History:  Procedure Laterality Date  . BACK SURGERY      There were no vitals filed for this visit.   Subjective Assessment - 12/27/17 1505    Subjective  I feel fine! The pain goes down my legs sometimes.    Patient is accompained by:  Family member wife and two daughters    Pertinent History  Presented with bronchitis and work-up sowed lung mass. Diagnosis is squamous cell carcinoma of lung, stage IIIA. He is expected to have chemoradiation. Pt. never smoked. h/o back surgery for spinal stenosis. Arthritis; HTN, hypothyroid.    Patient Stated Goals  get info from all lung clinic providers    Currently in Pain?  Yes    Pain Score  6     Pain Location  Back    Pain Orientation  Lower    Pain Descriptors / Indicators  Other (Comment) "pain that goes down my legs"    Pain Type  Chronic pain    Pain Radiating Towards  both hips and thighs    Aggravating Factors   staying in one position a while    Pain Relieving Factors  changing positions         [facility] PT Assessment - 12/27/17 0001      Assessment   Medical Diagnosis  stage IIIA lung cancer, squamous cell    Referring Provider  Dr. Curt Bears    Onset  Date/Surgical Date  11/12/17 approx.    Prior Therapy  none      Precautions   Precautions  Other (comment)    Precaution Comments  cancer precautions      Restrictions   Weight Bearing Restrictions  No      Balance Screen   Has the patient fallen in the past 6 months  No    Has the patient had a decrease in activity level because of a fear of falling?   No    Is the patient reluctant to leave their home because of a fear of falling?   No      Home Environment   Living Environment  Private residence    Living Arrangements  Spouse/significant other    Type of [place]  One level      Prior Function   Level of Independence  Independent    Leisure  no regular exercise      Cognition   Overall Cognitive Status  Within Functional Limits for tasks assessed      Observation/Other Assessments   Observations  well looking gentleman who looks  a bit younger than his age      Coordination   Gross Motor Movements are Fluid and Coordinated  Yes      Functional Tests   Functional tests  Sit to Stand      Sit to Stand   Comments  2x in 30 seconds with encouragement; he didn't think he could do it at all      Posture/Postural Control   Posture/Postural Control  No significant limitations slight forward head      ROM / Strength   AROM / PROM / Strength  AROM      AROM   Overall AROM Comments  In standing, active trunk ROM: flexion mildly limited, extension 75% limited and painful; sidebend 25% limited bilat.; rotation 50% limited right and 75% limited left      Ambulation/Gait   Ambulation/Gait  Yes    Ambulation/Gait Assistance  6: Modified independent (Device/Increase time)    Assistive device  None    Gait Comments  he is unsteady when he first starts walking, and improves as he walks a little bit; keeps both feet turned out, slight antalgic gait pattern      Balance   Balance Assessed  Yes      Dynamic Standing Balance   Dynamic Standing - Comments   reaches forward 5 inches in standing, below average for age daughter stands to ensure he doesn't fall for this test                Objective measurements completed on examination: See above findings.              PT Education - 12/27/17 1538    Education provided  Yes    Education Details  energy conservation, walking, CURE article on staying active, "Why exercise?" handout, posture, breathing, PT info    Person(s) Educated  Patient;Spouse;Child(ren)    Methods  Explanation;Handout    Comprehension  Verbalized understanding            Lung Clinic Goals - 12/27/17 1546      Patient will be able to verbalize understanding of the benefit of exercise to decrease fatigue.   Status  Achieved      Patient will be able to verbalize the importance of posture.   Status  Achieved      Patient will be able to demonstrate diaphragmatic breathing for improved lung function.   Status  Achieved      Patient will be able to verbalize understanding of the role of physical therapy to prevent functional decline and who to contact if physical therapy is needed.   Status  Achieved           Plan - 12/27/17 1539    Clinical Impression Statement  This is a pleasant gentleman with a new diagnosis of stage IIIA lung cancer; he expects to have chemoradiation treatment. He has limited back flexibility, decreased ability to stand without pushing off from hands, unsteadiness on feet.    History and Personal Factors relevant to plan of care:  h/o back surgery for spinal stenosis; back and hip pain    Clinical Presentation  Evolving    Clinical Presentation due to:  new diagnosis and just about to start chemoradiation    Clinical Decision Making  Moderate    Rehab Potential  Good    PT Frequency  One time visit    PT Treatment/Interventions  Patient/family education    PT Next Visit Plan  No follow-up  planned at this time as patient is about to start chemoradiation treatment.  He  may benefit from therapy going forward, should he have issues with fatigue, but also due to his back pain, decreased flexibility and impaired mobility.    PT Home Exercise Plan  walking, breathing exercise    Consulted and Agree with Plan of Care  Patient       Patient will benefit from skilled therapeutic intervention in order to improve the following deficits and impairments:  Decreased balance, Decreased mobility, Decreased strength, Impaired flexibility  Visit Diagnosis: Unsteadiness on feet - Plan: PT plan of care cert/re-cert  Malignant neoplasm of upper lobe of lung, unspecified laterality ([place]) - Plan: PT plan of care cert/re-cert  Other symptoms and signs involving the musculoskeletal system - Plan: PT plan of care cert/re-cert  Other abnormalities of gait and mobility - Plan: PT plan of care cert/re-cert     Problem List Patient Active Problem List   Diagnosis Date Noted  . Stage III squamous cell carcinoma of left lung ([place]) 12/27/2017  . Goals of care, counseling/discussion 12/27/2017  . Encounter for antineoplastic chemotherapy 12/27/2017    Frank Wood 12/27/2017, 3:48 PM  [facility] [place], [place], [address] Phone: [phone]   Fax:  [phone]  Name: Frank Wood MRN: [id number] Date of Birth: [birth year]  Serafina Royals, PT 12/27/17 3:48 PM

## 2017-12-28 ENCOUNTER — Inpatient Hospital Stay
Admission: RE | Admit: 2017-12-28 | Discharge: 2017-12-28 | Disposition: A | Discharge status: Home / Self Care | Payer: Self-pay | Source: Ambulatory Visit | Attending: Radiation Oncology | Admitting: Radiation Oncology

## 2017-12-28 ENCOUNTER — Other Ambulatory Visit: Payer: Self-pay | Admitting: Radiation Oncology

## 2017-12-28 ENCOUNTER — Ambulatory Visit
Admission: RE | Admit: 2017-12-28 | Discharge: 2017-12-28 | Disposition: A | Discharge status: Home / Self Care | Payer: Self-pay | Source: Ambulatory Visit | Attending: Radiation Oncology | Admitting: Radiation Oncology

## 2017-12-28 DIAGNOSIS — C801 Malignant (primary) neoplasm, unspecified: Secondary | ICD-10-CM

## 2017-12-29 ENCOUNTER — Encounter: Payer: Self-pay | Admitting: Internal Medicine

## 2017-12-31 ENCOUNTER — Telehealth: Payer: Self-pay | Admitting: Internal Medicine

## 2017-12-31 NOTE — Telephone Encounter (Signed)
Scheduled appt per 5/16 los - pt daughter aware of appts added - pt to get an updated schedule next visit for chemo edu

## 2018-01-03 ENCOUNTER — Telehealth: Payer: Self-pay | Admitting: Radiation Oncology

## 2018-01-03 ENCOUNTER — Ambulatory Visit (HOSPITAL_COMMUNITY)
Admission: RE | Admit: 2018-01-03 | Discharge: 2018-01-03 | Disposition: A | Discharge status: Home / Self Care | Payer: Medicare Other | Source: Ambulatory Visit | Attending: Radiation Oncology | Admitting: Radiation Oncology

## 2018-01-03 DIAGNOSIS — R590 Localized enlarged lymph nodes: Secondary | ICD-10-CM | POA: Insufficient documentation

## 2018-01-03 DIAGNOSIS — I700 Atherosclerosis of aorta: Secondary | ICD-10-CM | POA: Insufficient documentation

## 2018-01-03 DIAGNOSIS — R918 Other nonspecific abnormal finding of lung field: Secondary | ICD-10-CM | POA: Diagnosis not present

## 2018-01-03 DIAGNOSIS — I2510 Atherosclerotic heart disease of native coronary artery without angina pectoris: Secondary | ICD-10-CM | POA: Diagnosis not present

## 2018-01-03 DIAGNOSIS — G319 Degenerative disease of nervous system, unspecified: Secondary | ICD-10-CM | POA: Insufficient documentation

## 2018-01-03 DIAGNOSIS — G939 Disorder of brain, unspecified: Secondary | ICD-10-CM | POA: Insufficient documentation

## 2018-01-03 DIAGNOSIS — C3490 Malignant neoplasm of unspecified part of unspecified bronchus or lung: Secondary | ICD-10-CM

## 2018-01-03 DIAGNOSIS — I6782 Cerebral ischemia: Secondary | ICD-10-CM | POA: Insufficient documentation

## 2018-01-03 DIAGNOSIS — C3492 Malignant neoplasm of unspecified part of left bronchus or lung: Secondary | ICD-10-CM | POA: Insufficient documentation

## 2018-01-03 LAB — GLUCOSE, CAPILLARY: GLUCOSE-CAPILLARY: 128 mg/dL — AB (ref 65–99)

## 2018-01-03 MED ORDER — FLUDEOXYGLUCOSE F - 18 (FDG) INJECTION: 745.0000 | Freq: Once | INTRAVENOUS | Status: DC | PRN

## 2018-01-03 MED ORDER — GADOBENATE DIMEGLUMINE 529 MG/ML IV SOLN
20.0000 mL | Freq: Once | INTRAVENOUS | Status: AC | PRN
  Administered 2018-01-03: 18 mL via INTRAVENOUS

## 2018-01-03 MED ORDER — FLUDEOXYGLUCOSE F - 18 (FDG) INJECTION
9.9100 | Freq: Once | INTRAVENOUS | Status: AC | PRN
  Administered 2018-01-03: 9.91 via INTRAVENOUS

## 2018-01-03 NOTE — Telephone Encounter (Signed)
I spoke with Frank Wood, Mr. Gaba daughter and discussed the PET scan findings, and brain MRI results. We will present his case in brain oncology conference and review next Wednesday. I'll be in touch with them regarding the discussion from that meeting. Dr. Tammi Klippel would like to proceed with simulation tomorrow. The patient will come at 10:30 for nurse eval, simulation at 11:00, and proceed to chemo education at noon. We will plan to start his treatment Wednesday 5/29 and his chemotherapy is scheduled for that date as well.

## 2018-01-04 ENCOUNTER — Inpatient Hospital Stay: Payer: Medicare Other

## 2018-01-04 ENCOUNTER — Ambulatory Visit
Admission: RE | Admit: 2018-01-04 | Discharge: 2018-01-04 | Disposition: A | Discharge status: Home / Self Care | Payer: Medicare Other | Source: Ambulatory Visit | Attending: Radiation Oncology | Admitting: Radiation Oncology

## 2018-01-04 DIAGNOSIS — Z510 Encounter for antineoplastic radiation therapy: Secondary | ICD-10-CM | POA: Diagnosis not present

## 2018-01-04 DIAGNOSIS — C3412 Malignant neoplasm of upper lobe, left bronchus or lung: Secondary | ICD-10-CM | POA: Insufficient documentation

## 2018-01-07 NOTE — Progress Notes (Signed)
  Radiation Oncology         [phone] ________________________________  Name: Frank Wood [facility] MRN: [id number]  Date: 01/04/2018  DOB: 1932/11/28  SIMULATION AND TREATMENT PLANNING NOTE    ICD-10-CM   1. Stage III squamous cell carcinoma of left lung (HCC) C34.12     DIAGNOSIS:  84 y.o. gentleman with stage IIIB, [id number], squamous cell carcinoma of the left upper lobe  NARRATIVE:  The patient was brought to the [facility].  Identity was confirmed.  All relevant records and images related to the planned course of therapy were reviewed.  The patient freely provided informed written consent to proceed with treatment after reviewing the details related to the planned course of therapy. The consent form was witnessed and verified by the simulation staff.  Then, the patient was set-up in a stable reproducible  supine position for radiation therapy.  CT images were obtained.  Surface markings were placed.  The CT images were loaded into the planning software.  Then the target and avoidance structures were contoured.  Treatment planning then occurred.  The radiation prescription was entered and confirmed.  Then, I designed and supervised the construction of a total of 6 medically necessary complex treatment devices, including a BodyFix immobilization mold custom fitted to the patient along with 5 multileaf collimators conformally shaped radiation around the treatment target while shielding critical structures such as the heart and spinal cord maximally.  I have requested : 3D Simulation  I have requested a DVH of the following structures: Left lung, right lung, spinal cord, heart, esophagus, and target.  I have ordered:Nutrition Consult  SPECIAL TREATMENT PROCEDURE:  The planned course of therapy using radiation constitutes a special treatment procedure. Special care is required in the management of this patient for the following reasons.  The patient will be receiving concurrent  chemotherapy requiring careful monitoring for increased toxicities of treatment including periodic laboratory values.  The special nature of the planned course of radiotherapy will require increased physician supervision and oversight to ensure patient's safety with optimal treatment outcomes.  PLAN:  The patient will receive 66 Gy in 33 fractions.  ________________________________  Sheral Apley Tammi Klippel, M.D.

## 2018-01-08 DIAGNOSIS — Z510 Encounter for antineoplastic radiation therapy: Secondary | ICD-10-CM | POA: Diagnosis not present

## 2018-01-09 ENCOUNTER — Telehealth: Payer: Self-pay | Admitting: Radiation Oncology

## 2018-01-09 ENCOUNTER — Ambulatory Visit
Admission: RE | Admit: 2018-01-09 | Discharge: 2018-01-09 | Disposition: A | Discharge status: Home / Self Care | Payer: Medicare Other | Source: Ambulatory Visit | Attending: Radiation Oncology | Admitting: Radiation Oncology

## 2018-01-09 ENCOUNTER — Inpatient Hospital Stay: Payer: Medicare Other

## 2018-01-09 VITALS — BP 136/60 | HR 62 | Temp 98.9°F | Resp 16

## 2018-01-09 DIAGNOSIS — Z510 Encounter for antineoplastic radiation therapy: Secondary | ICD-10-CM | POA: Diagnosis not present

## 2018-01-09 DIAGNOSIS — C3412 Malignant neoplasm of upper lobe, left bronchus or lung: Secondary | ICD-10-CM

## 2018-01-09 DIAGNOSIS — C3492 Malignant neoplasm of unspecified part of left bronchus or lung: Secondary | ICD-10-CM

## 2018-01-09 DIAGNOSIS — Z5111 Encounter for antineoplastic chemotherapy: Secondary | ICD-10-CM | POA: Diagnosis not present

## 2018-01-09 LAB — CBC WITH DIFFERENTIAL (CANCER CENTER ONLY)
BASOS ABS: 0 10*3/uL (ref 0.0–0.1)
Basophils Relative: 0 %
Eosinophils Absolute: 0.2 10*3/uL (ref 0.0–0.5)
Eosinophils Relative: 2 %
HEMATOCRIT: 38.3 % — AB (ref 38.4–49.9)
Hemoglobin: 12.3 g/dL — ABNORMAL LOW (ref 13.0–17.1)
LYMPHS ABS: 1.5 10*3/uL (ref 0.9–3.3)
LYMPHS PCT: 22 %
MCH: 28.5 pg (ref 27.2–33.4)
MCHC: 32.1 g/dL (ref 32.0–36.0)
MCV: 88.9 fL (ref 79.3–98.0)
MONO ABS: 0.4 10*3/uL (ref 0.1–0.9)
Monocytes Relative: 6 %
NEUTROS ABS: 4.7 10*3/uL (ref 1.5–6.5)
Neutrophils Relative %: 70 %
Platelet Count: 266 10*3/uL (ref 140–400)
RBC: 4.31 MIL/uL (ref 4.20–5.82)
RDW: 13.4 % (ref 11.0–14.6)
WBC: 6.7 10*3/uL (ref 4.0–10.3)

## 2018-01-09 LAB — CMP (CANCER CENTER ONLY)
ALBUMIN: 3.7 g/dL (ref 3.5–5.0)
ALT: 14 U/L (ref 0–55)
ANION GAP: 12 — AB (ref 3–11)
AST: 17 U/L (ref 5–34)
Alkaline Phosphatase: 71 U/L (ref 40–150)
BILIRUBIN TOTAL: 0.4 mg/dL (ref 0.2–1.2)
BUN: 22 mg/dL (ref 7–26)
CHLORIDE: 106 mmol/L (ref 98–109)
CO2: 20 mmol/L — ABNORMAL LOW (ref 22–29)
Calcium: 9.4 mg/dL (ref 8.4–10.4)
Creatinine: 0.98 mg/dL (ref 0.70–1.30)
GFR, Est AFR Am: >60 mL/min (ref >60)
GFR, Estimated: >60 mL/min (ref >60)
GLUCOSE: 185 mg/dL — AB (ref 70–140)
Potassium: 4 mmol/L (ref 3.5–5.1)
Sodium: 138 mmol/L (ref 136–145)
TOTAL PROTEIN: 7.5 g/dL (ref 6.4–8.3)

## 2018-01-09 MED ORDER — DEXAMETHASONE SODIUM PHOSPHATE 10 MG/ML IJ SOLN
INTRAMUSCULAR | Status: AC
Start: 2018-01-09 — End: ?
  Filled 2018-01-09: qty 1

## 2018-01-09 MED ORDER — DEXAMETHASONE SODIUM PHOSPHATE 100 MG/10ML IJ SOLN
20.0000 mg | Freq: Once | INTRAMUSCULAR | Status: AC
  Administered 2018-01-09: 20 mg via INTRAVENOUS
  Filled 2018-01-09: qty 2

## 2018-01-09 MED ORDER — FAMOTIDINE IN NACL 20-0.9 MG/50ML-% IV SOLN
INTRAVENOUS | Status: AC
  Filled 2018-01-09: qty 50

## 2018-01-09 MED ORDER — PALONOSETRON HCL INJECTION 0.25 MG/5ML
INTRAVENOUS | Status: AC
  Filled 2018-01-09: qty 5

## 2018-01-09 MED ORDER — PACLITAXEL CHEMO INJECTION 300 MG/50ML
45.0000 mg/m2 | Freq: Once | INTRAVENOUS | Status: AC
  Administered 2018-01-09: 96 mg via INTRAVENOUS
  Filled 2018-01-09: qty 16

## 2018-01-09 MED ORDER — DIPHENHYDRAMINE HCL 50 MG/ML IJ SOLN
INTRAMUSCULAR | Status: AC
Start: 2018-01-09 — End: ?
  Filled 2018-01-09: qty 1

## 2018-01-09 MED ORDER — SODIUM CHLORIDE 0.9 % IV SOLN
175.2000 mg | Freq: Once | INTRAVENOUS | Status: AC
  Administered 2018-01-09: 180 mg via INTRAVENOUS
  Filled 2018-01-09: qty 18

## 2018-01-09 MED ORDER — FAMOTIDINE IN NACL 20-0.9 MG/50ML-% IV SOLN
20.0000 mg | Freq: Once | INTRAVENOUS | Status: AC
  Administered 2018-01-09: 20 mg via INTRAVENOUS

## 2018-01-09 MED ORDER — SODIUM CHLORIDE 0.9 % IV SOLN
Freq: Once | INTRAVENOUS | Status: AC
  Administered 2018-01-09: 09:00:00 via INTRAVENOUS

## 2018-01-09 MED ORDER — DIPHENHYDRAMINE HCL 50 MG/ML IJ SOLN
50.0000 mg | Freq: Once | INTRAMUSCULAR | Status: AC
  Administered 2018-01-09: 50 mg via INTRAVENOUS

## 2018-01-09 MED ORDER — PALONOSETRON HCL INJECTION 0.25 MG/5ML
0.2500 mg | Freq: Once | INTRAVENOUS | Status: AC
  Administered 2018-01-09: 0.25 mg via INTRAVENOUS

## 2018-01-09 NOTE — Patient Instructions (Addendum)
[facility] Discharge Instructions for Patients Receiving Chemotherapy  Today you received the following chemotherapy agents taxol and carboplatin.  To help prevent nausea and vomiting after your treatment, we encourage you to take your nausea medication .   If you develop nausea and vomiting that is not controlled by your nausea medication, call the clinic.   BELOW ARE SYMPTOMS THAT SHOULD BE REPORTED IMMEDIATELY:  *FEVER GREATER THAN 100.5 F  *CHILLS WITH OR WITHOUT FEVER  NAUSEA AND VOMITING THAT IS NOT CONTROLLED WITH YOUR NAUSEA MEDICATION  *UNUSUAL SHORTNESS OF BREATH  *UNUSUAL BRUISING OR BLEEDING  TENDERNESS IN MOUTH AND THROAT WITH OR WITHOUT PRESENCE OF ULCERS  *URINARY PROBLEMS  *BOWEL PROBLEMS  UNUSUAL RASH Items with * indicate a potential emergency and should be followed up as soon as possible.  Feel free to call the clinic should you have any questions or concerns. The clinic phone number is [phone].  Please show the [facility] at check-in to the Emergency Department and triage nurse.   Paclitaxel injection What is this medicine? PACLITAXEL (PAK li TAX el) is a chemotherapy drug. It targets fast dividing cells, like cancer cells, and causes these cells to die. This medicine is used to treat ovarian cancer, breast cancer, and other cancers. This medicine may be used for other purposes; ask your health care provider or pharmacist if you have questions. COMMON BRAND NAME(S): Onxol, Taxol What should I tell my health care provider before I take this medicine? They need to know if you have any of these conditions: -blood disorders -irregular heartbeat -infection (especially a virus infection such as chickenpox, cold sores, or herpes) -liver disease -previous or ongoing radiation therapy -an unusual or allergic reaction to paclitaxel, alcohol, polyoxyethylated castor oil, other chemotherapy agents, other medicines, foods, dyes, or  preservatives -pregnant or trying to get pregnant -breast-feeding How should I use this medicine? This drug is given as an infusion into a vein. It is administered in a hospital or clinic by a specially trained health care professional. Talk to your pediatrician regarding the use of this medicine in children. Special care may be needed. Overdosage: If you think you have taken too much of this medicine contact a poison control center or emergency room at once. NOTE: This medicine is only for you. Do not share this medicine with others. What if I miss a dose? It is important not to miss your dose. Call your doctor or health care professional if you are unable to keep an appointment. What may interact with this medicine? Do not take this medicine with any of the following medications: -disulfiram -metronidazole This medicine may also interact with the following medications: -cyclosporine -diazepam -ketoconazole -medicines to increase blood counts like filgrastim, pegfilgrastim, sargramostim -other chemotherapy drugs like cisplatin, doxorubicin, epirubicin, etoposide, teniposide, vincristine -quinidine -testosterone -vaccines -verapamil Talk to your doctor or health care professional before taking any of these medicines: -acetaminophen -aspirin -ibuprofen -ketoprofen -naproxen This list may not describe all possible interactions. Give your health care provider a list of all the medicines, herbs, non-prescription drugs, or dietary supplements you use. Also tell them if you smoke, drink alcohol, or use illegal drugs. Some items may interact with your medicine. What should I watch for while using this medicine? Your condition will be monitored carefully while you are receiving this medicine. You will need important blood work done while you are taking this medicine. This medicine can cause serious allergic reactions. To reduce your risk you will need  to take other medicine(s) before  treatment with this medicine. If you experience allergic reactions like skin rash, itching or hives, swelling of the face, lips, or tongue, tell your doctor or health care professional right away. In some cases, you may be given additional medicines to help with side effects. Follow all directions for their use. This drug may make you feel generally unwell. This is not uncommon, as chemotherapy can affect healthy cells as well as cancer cells. Report any side effects. Continue your course of treatment even though you feel ill unless your doctor tells you to stop. Call your doctor or health care professional for advice if you get a fever, chills or sore throat, or other symptoms of a cold or flu. Do not treat yourself. This drug decreases your body's ability to fight infections. Try to avoid being around people who are sick. This medicine may increase your risk to bruise or bleed. Call your doctor or health care professional if you notice any unusual bleeding. Be careful brushing and flossing your teeth or using a toothpick because you may get an infection or bleed more easily. If you have any dental work done, tell your dentist you are receiving this medicine. Avoid taking products that contain aspirin, acetaminophen, ibuprofen, naproxen, or ketoprofen unless instructed by your doctor. These medicines may hide a fever. Do not become pregnant while taking this medicine. Women should inform their doctor if they wish to become pregnant or think they might be pregnant. There is a potential for serious side effects to an unborn child. Talk to your health care professional or pharmacist for more information. Do not breast-feed an infant while taking this medicine. Men are advised not to father a child while receiving this medicine. This product may contain alcohol. Ask your pharmacist or healthcare provider if this medicine contains alcohol. Be sure to tell all healthcare providers you are taking this medicine.  Certain medicines, like metronidazole and disulfiram, can cause an unpleasant reaction when taken with alcohol. The reaction includes flushing, headache, nausea, vomiting, sweating, and increased thirst. The reaction can last from 30 minutes to several hours. What side effects may I notice from receiving this medicine? Side effects that you should report to your doctor or health care professional as soon as possible: -allergic reactions like skin rash, itching or hives, swelling of the face, lips, or tongue -low blood counts - This drug may decrease the number of white blood cells, red blood cells and platelets. You may be at increased risk for infections and bleeding. -signs of infection - fever or chills, cough, sore throat, pain or difficulty passing urine -signs of decreased platelets or bleeding - bruising, pinpoint red spots on the skin, black, tarry stools, nosebleeds -signs of decreased red blood cells - unusually weak or tired, fainting spells, lightheadedness -breathing problems -chest pain -high or low blood pressure -mouth sores -nausea and vomiting -pain, swelling, redness or irritation at the injection site -pain, tingling, numbness in the hands or feet -slow or irregular heartbeat -swelling of the ankle, feet, hands Side effects that usually do not require medical attention (report to your doctor or health care professional if they continue or are bothersome): -bone pain -complete hair loss including hair on your head, underarms, pubic hair, eyebrows, and eyelashes -changes in the color of fingernails -diarrhea -loosening of the fingernails -loss of appetite -muscle or joint pain -red flush to skin -sweating This list may not describe all possible side effects. Call your doctor for medical advice  about side effects. You may report side effects to FDA at 1-800-FDA-1088. Where should I keep my medicine? This drug is given in a hospital or clinic and will not be stored at  home. NOTE: This sheet is a summary. It may not cover all possible information. If you have questions about this medicine, talk to your doctor, pharmacist, or health care provider.  2018 Elsevier/Gold Standard (2015-06-01 19:58:00)  Carboplatin injection What is this medicine? CARBOPLATIN (KAR boe pla tin) is a chemotherapy drug. It targets fast dividing cells, like cancer cells, and causes these cells to die. This medicine is used to treat ovarian cancer and many other cancers. This medicine may be used for other purposes; ask your health care provider or pharmacist if you have questions. COMMON BRAND NAME(S): Paraplatin What should I tell my health care provider before I take this medicine? They need to know if you have any of these conditions: -blood disorders -hearing problems -kidney disease -recent or ongoing radiation therapy -an unusual or allergic reaction to carboplatin, cisplatin, other chemotherapy, other medicines, foods, dyes, or preservatives -pregnant or trying to get pregnant -breast-feeding How should I use this medicine? This drug is usually given as an infusion into a vein. It is administered in a hospital or clinic by a specially trained health care professional. Talk to your pediatrician regarding the use of this medicine in children. Special care may be needed. Overdosage: If you think you have taken too much of this medicine contact a poison control center or emergency room at once. NOTE: This medicine is only for you. Do not share this medicine with others. What if I miss a dose? It is important not to miss a dose. Call your doctor or health care professional if you are unable to keep an appointment. What may interact with this medicine? -medicines for seizures -medicines to increase blood counts like filgrastim, pegfilgrastim, sargramostim -some antibiotics like amikacin, gentamicin, neomycin, streptomycin, tobramycin -vaccines Talk to your doctor or health  care professional before taking any of these medicines: -acetaminophen -aspirin -ibuprofen -ketoprofen -naproxen This list may not describe all possible interactions. Give your health care provider a list of all the medicines, herbs, non-prescription drugs, or dietary supplements you use. Also tell them if you smoke, drink alcohol, or use illegal drugs. Some items may interact with your medicine. What should I watch for while using this medicine? Your condition will be monitored carefully while you are receiving this medicine. You will need important blood work done while you are taking this medicine. This drug may make you feel generally unwell. This is not uncommon, as chemotherapy can affect healthy cells as well as cancer cells. Report any side effects. Continue your course of treatment even though you feel ill unless your doctor tells you to stop. In some cases, you may be given additional medicines to help with side effects. Follow all directions for their use. Call your doctor or health care professional for advice if you get a fever, chills or sore throat, or other symptoms of a cold or flu. Do not treat yourself. This drug decreases your body's ability to fight infections. Try to avoid being around people who are sick. This medicine may increase your risk to bruise or bleed. Call your doctor or health care professional if you notice any unusual bleeding. Be careful brushing and flossing your teeth or using a toothpick because you may get an infection or bleed more easily. If you have any dental work done, tell your dentist  you are receiving this medicine. Avoid taking products that contain aspirin, acetaminophen, ibuprofen, naproxen, or ketoprofen unless instructed by your doctor. These medicines may hide a fever. Do not become pregnant while taking this medicine. Women should inform their doctor if they wish to become pregnant or think they might be pregnant. There is a potential for serious  side effects to an unborn child. Talk to your health care professional or pharmacist for more information. Do not breast-feed an infant while taking this medicine. What side effects may I notice from receiving this medicine? Side effects that you should report to your doctor or health care professional as soon as possible: -allergic reactions like skin rash, itching or hives, swelling of the face, lips, or tongue -signs of infection - fever or chills, cough, sore throat, pain or difficulty passing urine -signs of decreased platelets or bleeding - bruising, pinpoint red spots on the skin, black, tarry stools, nosebleeds -signs of decreased red blood cells - unusually weak or tired, fainting spells, lightheadedness -breathing problems -changes in hearing -changes in vision -chest pain -high blood pressure -low blood counts - This drug may decrease the number of white blood cells, red blood cells and platelets. You may be at increased risk for infections and bleeding. -nausea and vomiting -pain, swelling, redness or irritation at the injection site -pain, tingling, numbness in the hands or feet -problems with balance, talking, walking -trouble passing urine or change in the amount of urine Side effects that usually do not require medical attention (report to your doctor or health care professional if they continue or are bothersome): -hair loss -loss of appetite -metallic taste in the mouth or changes in taste This list may not describe all possible side effects. Call your doctor for medical advice about side effects. You may report side effects to FDA at 1-800-FDA-1088. Where should I keep my medicine? This drug is given in a hospital or clinic and will not be stored at home. NOTE: This sheet is a summary. It may not cover all possible information. If you have questions about this medicine, talk to your doctor, pharmacist, or health care provider.  2018 Elsevier/Gold Standard (2007-11-05  14:38:05)

## 2018-01-09 NOTE — Telephone Encounter (Signed)
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 She will communicate this with the patient who is currently receiving chemotherapy.

## 2018-01-10 ENCOUNTER — Ambulatory Visit
Admission: RE | Admit: 2018-01-10 | Discharge: 2018-01-10 | Disposition: A | Discharge status: Home / Self Care | Payer: Medicare Other | Source: Ambulatory Visit | Attending: Radiation Oncology | Admitting: Radiation Oncology

## 2018-01-10 ENCOUNTER — Other Ambulatory Visit: Payer: Self-pay | Admitting: Internal Medicine

## 2018-01-10 DIAGNOSIS — Z510 Encounter for antineoplastic radiation therapy: Secondary | ICD-10-CM | POA: Diagnosis not present

## 2018-01-11 ENCOUNTER — Ambulatory Visit
Admission: RE | Admit: 2018-01-11 | Discharge: 2018-01-11 | Disposition: A | Discharge status: Home / Self Care | Payer: Medicare Other | Source: Ambulatory Visit | Attending: Radiation Oncology | Admitting: Radiation Oncology

## 2018-01-11 DIAGNOSIS — Z510 Encounter for antineoplastic radiation therapy: Secondary | ICD-10-CM | POA: Diagnosis not present

## 2018-01-14 ENCOUNTER — Ambulatory Visit
Admission: RE | Admit: 2018-01-14 | Discharge: 2018-01-14 | Disposition: A | Discharge status: Home / Self Care | Payer: Medicare Other | Source: Ambulatory Visit | Attending: Radiation Oncology | Admitting: Radiation Oncology

## 2018-01-14 DIAGNOSIS — C3412 Malignant neoplasm of upper lobe, left bronchus or lung: Secondary | ICD-10-CM | POA: Insufficient documentation

## 2018-01-14 DIAGNOSIS — Z510 Encounter for antineoplastic radiation therapy: Secondary | ICD-10-CM | POA: Insufficient documentation

## 2018-01-15 ENCOUNTER — Inpatient Hospital Stay: Payer: Medicare Other

## 2018-01-15 ENCOUNTER — Inpatient Hospital Stay: Payer: Medicare Other | Attending: Nurse Practitioner | Admitting: Nurse Practitioner

## 2018-01-15 ENCOUNTER — Telehealth: Payer: Self-pay

## 2018-01-15 ENCOUNTER — Ambulatory Visit
Admission: RE | Admit: 2018-01-15 | Discharge: 2018-01-15 | Disposition: A | Discharge status: Home / Self Care | Payer: Medicare Other | Source: Ambulatory Visit | Attending: Radiation Oncology | Admitting: Radiation Oncology

## 2018-01-15 ENCOUNTER — Encounter: Payer: Self-pay | Admitting: Nurse Practitioner

## 2018-01-15 VITALS — BP 173/73 | HR 105 | Temp 97.8°F | Resp 18 | Ht 71.0 in | Wt 186.6 lb

## 2018-01-15 VITALS — BP 138/60 | HR 88

## 2018-01-15 DIAGNOSIS — H409 Unspecified glaucoma: Secondary | ICD-10-CM | POA: Diagnosis not present

## 2018-01-15 DIAGNOSIS — E039 Hypothyroidism, unspecified: Secondary | ICD-10-CM | POA: Diagnosis not present

## 2018-01-15 DIAGNOSIS — Z79899 Other long term (current) drug therapy: Secondary | ICD-10-CM

## 2018-01-15 DIAGNOSIS — Z5111 Encounter for antineoplastic chemotherapy: Secondary | ICD-10-CM

## 2018-01-15 DIAGNOSIS — I10 Essential (primary) hypertension: Secondary | ICD-10-CM

## 2018-01-15 DIAGNOSIS — M1990 Unspecified osteoarthritis, unspecified site: Secondary | ICD-10-CM

## 2018-01-15 DIAGNOSIS — R634 Abnormal weight loss: Secondary | ICD-10-CM | POA: Diagnosis not present

## 2018-01-15 DIAGNOSIS — Z800 Family history of malignant neoplasm of digestive organs: Secondary | ICD-10-CM | POA: Diagnosis not present

## 2018-01-15 DIAGNOSIS — C3412 Malignant neoplasm of upper lobe, left bronchus or lung: Secondary | ICD-10-CM

## 2018-01-15 DIAGNOSIS — Z8042 Family history of malignant neoplasm of prostate: Secondary | ICD-10-CM | POA: Diagnosis not present

## 2018-01-15 DIAGNOSIS — C3492 Malignant neoplasm of unspecified part of left bronchus or lung: Secondary | ICD-10-CM

## 2018-01-15 DIAGNOSIS — Z510 Encounter for antineoplastic radiation therapy: Secondary | ICD-10-CM | POA: Diagnosis not present

## 2018-01-15 LAB — CMP (CANCER CENTER ONLY)
ALBUMIN: 4 g/dL (ref 3.5–5.0)
ALK PHOS: 73 U/L (ref 40–150)
ALT: 9 U/L (ref 0–55)
ANION GAP: 11 (ref 3–11)
AST: 15 U/L (ref 5–34)
BILIRUBIN TOTAL: 0.5 mg/dL (ref 0.2–1.2)
BUN: 23 mg/dL (ref 7–26)
CO2: 22 mmol/L (ref 22–29)
Calcium: 9.8 mg/dL (ref 8.4–10.4)
Chloride: 104 mmol/L (ref 98–109)
Creatinine: 1.12 mg/dL (ref 0.70–1.30)
GFR, Est AFR Am: >60 mL/min (ref >60)
GFR, Estimated: 59 mL/min — ABNORMAL LOW (ref >60)
GLUCOSE: 154 mg/dL — AB (ref 70–140)
POTASSIUM: 4.4 mmol/L (ref 3.5–5.1)
SODIUM: 137 mmol/L (ref 136–145)
TOTAL PROTEIN: 7.9 g/dL (ref 6.4–8.3)

## 2018-01-15 LAB — CBC WITH DIFFERENTIAL (CANCER CENTER ONLY)
Basophils Absolute: 0 10*3/uL (ref 0.0–0.1)
Basophils Relative: 0 %
EOS PCT: 2 %
Eosinophils Absolute: 0.1 10*3/uL (ref 0.0–0.5)
HEMATOCRIT: 39 % (ref 38.4–49.9)
Hemoglobin: 12.5 g/dL — ABNORMAL LOW (ref 13.0–17.1)
LYMPHS ABS: 1.1 10*3/uL (ref 0.9–3.3)
LYMPHS PCT: 22 %
MCH: 28.5 pg (ref 27.2–33.4)
MCHC: 32.1 g/dL (ref 32.0–36.0)
MCV: 89 fL (ref 79.3–98.0)
MONO ABS: 0.2 10*3/uL (ref 0.1–0.9)
MONOS PCT: 4 %
NEUTROS ABS: 3.8 10*3/uL (ref 1.5–6.5)
Neutrophils Relative %: 72 %
PLATELETS: 274 10*3/uL (ref 140–400)
RBC: 4.38 MIL/uL (ref 4.20–5.82)
RDW: 13.3 % (ref 11.0–14.6)
WBC Count: 5.2 10*3/uL (ref 4.0–10.3)

## 2018-01-15 MED ORDER — SODIUM CHLORIDE 0.9 % IV SOLN
45.0000 mg/m2 | Freq: Once | INTRAVENOUS | Status: AC
  Administered 2018-01-15: 96 mg via INTRAVENOUS
  Filled 2018-01-15: qty 16

## 2018-01-15 MED ORDER — PALONOSETRON HCL INJECTION 0.25 MG/5ML
0.2500 mg | Freq: Once | INTRAVENOUS | Status: AC
  Administered 2018-01-15: 0.25 mg via INTRAVENOUS

## 2018-01-15 MED ORDER — SODIUM CHLORIDE 0.9 % IV SOLN
175.2000 mg | Freq: Once | INTRAVENOUS | Status: AC
  Administered 2018-01-15: 180 mg via INTRAVENOUS
  Filled 2018-01-15: qty 18

## 2018-01-15 MED ORDER — FAMOTIDINE IN NACL 20-0.9 MG/50ML-% IV SOLN
INTRAVENOUS | Status: AC
  Filled 2018-01-15: qty 50

## 2018-01-15 MED ORDER — DIPHENHYDRAMINE HCL 50 MG/ML IJ SOLN
INTRAMUSCULAR | Status: AC
  Filled 2018-01-15: qty 1

## 2018-01-15 MED ORDER — PALONOSETRON HCL INJECTION 0.25 MG/5ML
INTRAVENOUS | Status: AC
  Filled 2018-01-15: qty 5

## 2018-01-15 MED ORDER — SODIUM CHLORIDE 0.9 % IV SOLN
Freq: Once | INTRAVENOUS | Status: AC
  Administered 2018-01-15: 15:00:00 via INTRAVENOUS

## 2018-01-15 MED ORDER — SODIUM CHLORIDE 0.9 % IV SOLN
20.0000 mg | Freq: Once | INTRAVENOUS | Status: AC
  Administered 2018-01-15: 20 mg via INTRAVENOUS
  Filled 2018-01-15: qty 2

## 2018-01-15 MED ORDER — FAMOTIDINE IN NACL 20-0.9 MG/50ML-% IV SOLN
20.0000 mg | Freq: Once | INTRAVENOUS | Status: AC
  Administered 2018-01-15: 20 mg via INTRAVENOUS

## 2018-01-15 MED ORDER — DIPHENHYDRAMINE HCL 50 MG/ML IJ SOLN
50.0000 mg | Freq: Once | INTRAMUSCULAR | Status: AC
  Administered 2018-01-15: 50 mg via INTRAVENOUS

## 2018-01-15 NOTE — Patient Instructions (Signed)
[facility] Discharge Instructions for Patients Receiving Chemotherapy  Today you received the following chemotherapy agents paclitaxel (Taxol) and carboplatin (Paraplatin).  To help prevent nausea and vomiting after your treatment, we encourage you to take your nausea medication .   If you develop nausea and vomiting that is not controlled by your nausea medication, call the clinic.   BELOW ARE SYMPTOMS THAT SHOULD BE REPORTED IMMEDIATELY:  *FEVER GREATER THAN 100.5 F  *CHILLS WITH OR WITHOUT FEVER  NAUSEA AND VOMITING THAT IS NOT CONTROLLED WITH YOUR NAUSEA MEDICATION  *UNUSUAL SHORTNESS OF BREATH  *UNUSUAL BRUISING OR BLEEDING  TENDERNESS IN MOUTH AND THROAT WITH OR WITHOUT PRESENCE OF ULCERS  *URINARY PROBLEMS  *BOWEL PROBLEMS  UNUSUAL RASH Items with * indicate a potential emergency and should be followed up as soon as possible.  Feel free to call the clinic should you have any questions or concerns. The clinic phone number is [phone].  Please show the [facility] at check-in to the Emergency Department and triage nurse.

## 2018-01-15 NOTE — Telephone Encounter (Signed)
Appointments scheduled . In basket message to Ginette Otto to add appt for 6/17 @ 9:45 per 6/4 los

## 2018-01-15 NOTE — Progress Notes (Addendum)
[place]  [phone] [phone]  Clinic Follow up Note   Patient Care Team: System, Pcp Not In as PCP - General 01/15/2018  SUMMARY OF ONCOLOGIC HISTORY:   Stage III squamous cell carcinoma of left lung ([place])   12/27/2017 Initial Diagnosis    Stage III squamous cell carcinoma of left lung ([place])      01/09/2018 -  Chemotherapy    The patient had palonosetron (ALOXI) injection 0.25 mg, 0.25 mg, Intravenous,  Once, 2 of 7 cycles Administration: 0.25 mg (01/09/2018) CARBOplatin (PARAPLATIN) 180 mg in sodium chloride 0.9 % 250 mL chemo infusion, 180 mg (100 % of original dose 175.2 mg), Intravenous,  Once, 2 of 7 cycles Dose modification: 175.2 mg (original dose 175.2 mg, Cycle 1) Administration: 180 mg (01/09/2018) PACLitaxel (TAXOL) 96 mg in sodium chloride 0.9 % 250 mL chemo infusion (</= 72m/m2), 45 mg/m2 = 96 mg, Intravenous,  Once, 2 of 7 cycles Administration: 96 mg (01/09/2018)  for chemotherapy treatment.      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 He began chemoRT with taxol/carbo on 01/09/18. He has no noticeable side effects. Has good energy level. Has mild weight loss despite good appetite. No nausea, vomiting, constipation, or diarrhea. Has not required compazine. Has minimal shortness of breath; denies cough, chest pain, hemoptysis, or wheezing. No fever or chills. 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 Sleep is fair at baseline. Denies pain.   REVIEW OF SYSTEMS:   Constitutional: Denies fatigue, fevers, chills (+) 4 lbs weight loss in 3 weeks (+) good appetite  Eyes:  Denies blurriness of vision Ears, nose, mouth, throat, and face: Denies mucositis or sore throat Respiratory: Denies cough, hemoptysis, or wheezes (+) mild dyspnea  Cardiovascular: Denies palpitation, chest discomfort or lower extremity swelling Gastrointestinal:  Denies nausea, vomiting, constipation, diarrhea, heartburn or change in bowel habits Skin: Denies abnormal skin rashes Lymphatics: Denies new lymphadenopathy or easy bruising Neurological:Denies numbness, tingling or new weaknesses (+) right leg neuropathy present prior to chemo  Behavioral/Psych: Mood is stable, no new changes (+) sleeps fairly well  All other systems were reviewed with the patient and are negative.  MEDICAL HISTORY:  Past Medical History:  Diagnosis Date  . Arthritis   . Diverticulosis   . Glaucoma   . Hypertension   . Hypothyroidism     SURGICAL HISTORY: Past Surgical History:  Procedure Laterality Date  . BACK SURGERY      I have reviewed the social history and family history with the patient and they are unchanged from previous note.  ALLERGIES:  has No Known Allergies.  MEDICATIONS:  Current Outpatient Medications  Medication Sig Dispense Refill  . benazepril (LOTENSIN) 20 MG tablet   1  . benazepril-hydrochlorthiazide (LOTENSIN HCT) 20-25 MG tablet Take 1 tablet by mouth daily.    .Marland Kitchengabapentin (NEURONTIN) 100 MG capsule Take 100 mg by mouth daily.    .Marland Kitchengemfibrozil (LOPID) 600 MG tablet Take 600 mg by mouth daily.    .Marland Kitchenlevothyroxine (SYNTHROID, LEVOTHROID) 150 MCG tablet   0  . meloxicam (MOBIC) 15 MG tablet Take 15 mg by mouth daily.    .Marland Kitchen  tamsulosin (FLOMAX) 0.4 MG CAPS capsule Take 0.4 mg by mouth.    . zolpidem (AMBIEN) 10 MG tablet Take 10 mg by mouth at bedtime as needed for sleep.    Marland Kitchen prochlorperazine (COMPAZINE) 10 MG tablet Take 1 tablet (10 mg total) by mouth every 6 (six) hours as needed for nausea or vomiting. (Patient not taking: Reported on 01/15/2018) 30 tablet 0   Current  Facility-Administered Medications  Medication Dose Route Frequency Provider Last Rate Last Dose  . 0.9 %  sodium chloride infusion  500 mL Intravenous Continuous Irene Shipper, MD        PHYSICAL EXAMINATION: ECOG PERFORMANCE STATUS: 0 - Asymptomatic  Vitals:   01/15/18 1355  BP: (!) 173/73  Pulse: (!) 105  Resp: 18  Temp: 97.8 F (36.6 C)  SpO2: 97%   Filed Weights   01/15/18 1355  Weight: 186 lb 9.6 oz (84.6 kg)    GENERAL:alert, no distress and comfortable SKIN: no rashes or significant lesions EYES: normal, Conjunctiva are pink and non-injected, sclera clear OROPHARYNX:no thrush or ulcers   LYMPH:  no palpable cervical or supraclavicular lymphadenopathy LUNGS: clear to auscultation with normal breathing effort HEART: regular rate & rhythm and no murmurs and no lower extremity edema ABDOMEN:abdomen soft, non-tender and normal bowel sounds Musculoskeletal:no cyanosis of digits and no clubbing  NEURO: alert & oriented x 3 with fluent speech, no focal motor/sensory deficits  LABORATORY DATA:  I have reviewed the data as listed CBC Latest Ref Rng & Units 01/15/2018 01/09/2018 12/27/2017  WBC 4.0 - 10.3 K/uL 5.2 6.7 7.9  Hemoglobin 13.0 - 17.1 g/dL 12.5(L) 12.3(L) 12.2(L)  Hematocrit 38.4 - 49.9 % 39.0 38.3(L) 37.8(L)  Platelets 140 - 400 K/uL 274 266 272     CMP Latest Ref Rng & Units 01/15/2018 01/09/2018 12/27/2017  Glucose 70 - 140 mg/dL 154(H) 185(H) 132  BUN 7 - 26 mg/dL 23 22 18   Creatinine 0.70 - 1.30 mg/dL 1.12 0.98 1.09  Sodium 136 - 145 mmol/L 137 138 139  Potassium 3.5 - 5.1 mmol/L 4.4 4.0 4.6  Chloride 98 - 109 mmol/L 104 106 107  CO2 22 - 29 mmol/L 22 20(L) 24  Calcium 8.4 - 10.4 mg/dL 9.8 9.4 9.4  Total Protein 6.4 - 8.3 g/dL 7.9 7.5 7.7  Total Bilirubin 0.2 - 1.2 mg/dL 0.5 0.4 0.4  Alkaline Phos 40 - 150 U/L 73 71 81  AST 5 - 34 U/L 15 17 15   ALT 0 - 55 U/L 9 14 13      RADIOGRAPHIC STUDIES: I have personally reviewed the radiological images as listed  and agreed with the findings in the report. No results found.   ASSESSMENT & PLAN:  Stage IIIa (T2aN2M0) squamous cell carcinoma of the left lung  Frank Wood is doing well. He completed cycle 1 taxol/carboplatin with concurrent radiation beginning 01/09/18. He is tolerating treatment very well without appreciable side effects. He has lost a few pounds, I recommend he supplement with boost/ensure to prevent further weight loss. Labs reviewed, CBC and CMP are stable, adequate to proceed with cycle 2. The patient was seen with Dr. Julien Nordmann who confirmed pathology results and reviewed staging work up. He recommends to continue current plan with chemoradiation. [facility] testing has been requested but transfer of pathology is pending. He began treatment on 5/29, a Wednesday, will request remaining lab, f/u, and chemotherapy appointments be moved to Mondays while on RT. He will return in 1 week for cycle 3,  F/u  with Dr. Julien Nordmann in 2 weeks with cycle 4.   All questions were answered. The patient knows to call the clinic with any problems, questions or concerns. No barriers to learning was detected.     Alla Feeling, NP 01/15/18    ADDENDUM: Hematology/Oncology Attending: I had a face-to-face encounter with the patient.  I recommended his care plan.  This is a very pleasant 84 years old white male recently diagnosed with a stage IIIa non-small cell lung cancer, squamous cell carcinoma.  Review of the pathology slides by the [facility] pathologist confirmed the diagnosis of squamous cell carcinoma.  We are also trying to send the tissue block to [facility] for molecular studies.  The patient was a started on a course of concurrent chemoradiation with weekly carboplatin and paclitaxel status post 1 week of treatment and tolerated the first week of his treatment fairly well.  He had MRI of the brain that showed no concerning findings or metastasis to the brain.  The patient also had a PET scan that showed  no evidence of metastatic disease outside the known lesions in the lung.  I discussed these results with the patient and his family. I recommended for him to continue his current treatment with concurrent chemoradiation and he will proceed with cycle #2 today. We will see the patient back for follow-up visit in 2 weeks for evaluation before starting cycle #4. He was advised to call immediately if he has any concerning symptoms in the interval.  Disclaimer: This note was dictated with voice recognition software. Similar sounding words can inadvertently be transcribed and may be missed upon review. Eilleen Kempf, MD 01/16/18

## 2018-01-16 ENCOUNTER — Ambulatory Visit
Admission: RE | Admit: 2018-01-16 | Discharge: 2018-01-16 | Disposition: A | Discharge status: Home / Self Care | Payer: Medicare Other | Source: Ambulatory Visit | Attending: Radiation Oncology | Admitting: Radiation Oncology

## 2018-01-16 ENCOUNTER — Telehealth: Payer: Self-pay | Admitting: Nurse Practitioner

## 2018-01-16 DIAGNOSIS — Z510 Encounter for antineoplastic radiation therapy: Secondary | ICD-10-CM | POA: Diagnosis not present

## 2018-01-16 DIAGNOSIS — C3412 Malignant neoplasm of upper lobe, left bronchus or lung: Secondary | ICD-10-CM

## 2018-01-16 MED ORDER — RADIAPLEXRX EX GEL
Freq: Once | CUTANEOUS | Status: AC
  Administered 2018-01-16: 12:00:00 via TOPICAL

## 2018-01-16 NOTE — Progress Notes (Signed)
Pt here for patient teaching.  Pt given Radiation and You booklet, skin care instructions and Radiaplex gel.  Reviewed areas of pertinence such as fatigue, skin changes and throat changes . Pt able to give teach back of to pat skin and use unscented/gentle soap,apply Radiaplex bid and avoid applying anything to skin within 4 hours of treatment. Pt demonstrated understanding, needs reinforcement, no evidence of learning, refused teaching and  of information given and will contact nursing with any questions or concerns.     [email or web address]

## 2018-01-16 NOTE — Telephone Encounter (Signed)
Appointment added to schedule per 6/4 los

## 2018-01-17 ENCOUNTER — Ambulatory Visit
Admission: RE | Admit: 2018-01-17 | Discharge: 2018-01-17 | Disposition: A | Discharge status: Home / Self Care | Payer: Medicare Other | Source: Ambulatory Visit | Attending: Radiation Oncology | Admitting: Radiation Oncology

## 2018-01-17 DIAGNOSIS — Z510 Encounter for antineoplastic radiation therapy: Secondary | ICD-10-CM | POA: Diagnosis not present

## 2018-01-18 ENCOUNTER — Ambulatory Visit
Admission: RE | Admit: 2018-01-18 | Discharge: 2018-01-18 | Disposition: A | Discharge status: Home / Self Care | Payer: Medicare Other | Source: Ambulatory Visit | Attending: Radiation Oncology | Admitting: Radiation Oncology

## 2018-01-18 DIAGNOSIS — Z510 Encounter for antineoplastic radiation therapy: Secondary | ICD-10-CM | POA: Diagnosis not present

## 2018-01-21 ENCOUNTER — Telehealth: Payer: Self-pay | Admitting: Radiation Oncology

## 2018-01-21 ENCOUNTER — Ambulatory Visit
Admission: RE | Admit: 2018-01-21 | Discharge: 2018-01-21 | Disposition: A | Discharge status: Home / Self Care | Payer: Medicare Other | Source: Ambulatory Visit | Attending: Radiation Oncology | Admitting: Radiation Oncology

## 2018-01-21 ENCOUNTER — Inpatient Hospital Stay: Payer: Medicare Other

## 2018-01-21 ENCOUNTER — Other Ambulatory Visit: Payer: Self-pay | Admitting: Radiation Oncology

## 2018-01-21 VITALS — BP 155/71 | HR 94 | Temp 97.6°F | Resp 17

## 2018-01-21 DIAGNOSIS — Z5111 Encounter for antineoplastic chemotherapy: Secondary | ICD-10-CM | POA: Diagnosis not present

## 2018-01-21 DIAGNOSIS — C3492 Malignant neoplasm of unspecified part of left bronchus or lung: Secondary | ICD-10-CM

## 2018-01-21 DIAGNOSIS — Z510 Encounter for antineoplastic radiation therapy: Secondary | ICD-10-CM | POA: Diagnosis not present

## 2018-01-21 DIAGNOSIS — C3412 Malignant neoplasm of upper lobe, left bronchus or lung: Secondary | ICD-10-CM

## 2018-01-21 LAB — CMP (CANCER CENTER ONLY)
ALT: 13 U/L (ref 0–55)
AST: 18 U/L (ref 5–34)
Albumin: 4.1 g/dL (ref 3.5–5.0)
Alkaline Phosphatase: 73 U/L (ref 40–150)
Anion gap: 9 (ref 3–11)
BUN: 23 mg/dL (ref 7–26)
CO2: 25 mmol/L (ref 22–29)
Calcium: 9.9 mg/dL (ref 8.4–10.4)
Chloride: 104 mmol/L (ref 98–109)
Creatinine: 1.05 mg/dL (ref 0.70–1.30)
GFR, Est AFR Am: >60 mL/min (ref >60)
GFR, Estimated: >60 mL/min (ref >60)
Glucose, Bld: 133 mg/dL (ref 70–140)
Potassium: 4.6 mmol/L (ref 3.5–5.1)
Sodium: 138 mmol/L (ref 136–145)
Total Bilirubin: 0.5 mg/dL (ref 0.2–1.2)
Total Protein: 7.9 g/dL (ref 6.4–8.3)

## 2018-01-21 LAB — CBC WITH DIFFERENTIAL (CANCER CENTER ONLY)
Basophils Absolute: 0 10*3/uL (ref 0.0–0.1)
Basophils Relative: 1 %
EOS ABS: 0 10*3/uL (ref 0.0–0.5)
Eosinophils Relative: 1 %
HCT: 36.3 % — ABNORMAL LOW (ref 38.4–49.9)
HEMOGLOBIN: 11.8 g/dL — AB (ref 13.0–17.1)
LYMPHS ABS: 0.5 10*3/uL — AB (ref 0.9–3.3)
LYMPHS PCT: 18 %
MCH: 28.4 pg (ref 27.2–33.4)
MCHC: 32.6 g/dL (ref 32.0–36.0)
MCV: 87.1 fL (ref 79.3–98.0)
MONOS PCT: 4 %
Monocytes Absolute: 0.1 10*3/uL (ref 0.1–0.9)
NEUTROS PCT: 76 %
Neutro Abs: 2.2 10*3/uL (ref 1.5–6.5)
Platelet Count: 233 10*3/uL (ref 140–400)
RBC: 4.17 MIL/uL — ABNORMAL LOW (ref 4.20–5.82)
RDW: 14 % (ref 11.0–14.6)
WBC Count: 2.9 10*3/uL — ABNORMAL LOW (ref 4.0–10.3)

## 2018-01-21 MED ORDER — FAMOTIDINE IN NACL 20-0.9 MG/50ML-% IV SOLN
20.0000 mg | Freq: Once | INTRAVENOUS | Status: AC
  Administered 2018-01-21: 20 mg via INTRAVENOUS

## 2018-01-21 MED ORDER — SUCRALFATE 1 G PO TABS: 1.0000 g | ORAL_TABLET | Freq: Three times a day (TID) | ORAL | 1 refills | Status: DC

## 2018-01-21 MED ORDER — FAMOTIDINE IN NACL 20-0.9 MG/50ML-% IV SOLN
INTRAVENOUS | Status: AC
  Filled 2018-01-21: qty 50

## 2018-01-21 MED ORDER — DIPHENHYDRAMINE HCL 50 MG/ML IJ SOLN
INTRAMUSCULAR | Status: AC
  Filled 2018-01-21: qty 1

## 2018-01-21 MED ORDER — DEXAMETHASONE SODIUM PHOSPHATE 10 MG/ML IJ SOLN
INTRAMUSCULAR | Status: AC
  Filled 2018-01-21: qty 1

## 2018-01-21 MED ORDER — SODIUM CHLORIDE 0.9 % IV SOLN
Freq: Once | INTRAVENOUS | Status: AC
  Administered 2018-01-21: 14:00:00 via INTRAVENOUS

## 2018-01-21 MED ORDER — SODIUM CHLORIDE 0.9 % IV SOLN
20.0000 mg | Freq: Once | INTRAVENOUS | Status: AC
  Administered 2018-01-21: 20 mg via INTRAVENOUS
  Filled 2018-01-21: qty 2

## 2018-01-21 MED ORDER — PALONOSETRON HCL INJECTION 0.25 MG/5ML
INTRAVENOUS | Status: AC
Start: 2018-01-21 — End: ?
  Filled 2018-01-21: qty 5

## 2018-01-21 MED ORDER — DIPHENHYDRAMINE HCL 50 MG/ML IJ SOLN
50.0000 mg | Freq: Once | INTRAMUSCULAR | Status: AC
  Administered 2018-01-21: 50 mg via INTRAVENOUS

## 2018-01-21 MED ORDER — SODIUM CHLORIDE 0.9 % IV SOLN
45.0000 mg/m2 | Freq: Once | INTRAVENOUS | Status: AC
  Administered 2018-01-21: 96 mg via INTRAVENOUS
  Filled 2018-01-21: qty 16

## 2018-01-21 MED ORDER — PALONOSETRON HCL INJECTION 0.25 MG/5ML
0.2500 mg | Freq: Once | INTRAVENOUS | Status: AC
  Administered 2018-01-21: 0.25 mg via INTRAVENOUS

## 2018-01-21 MED ORDER — SODIUM CHLORIDE 0.9 % IV SOLN
180.0000 mg | Freq: Once | INTRAVENOUS | Status: AC
  Administered 2018-01-21: 180 mg via INTRAVENOUS
  Filled 2018-01-21: qty 18

## 2018-01-21 NOTE — Patient Instructions (Signed)
[facility] Discharge Instructions for Patients Receiving Chemotherapy  Today you received the following chemotherapy agents paclitaxel (Taxol) and carboplatin (Paraplatin).  To help prevent nausea and vomiting after your treatment, we encourage you to take your nausea medication .   If you develop nausea and vomiting that is not controlled by your nausea medication, call the clinic.   BELOW ARE SYMPTOMS THAT SHOULD BE REPORTED IMMEDIATELY:  *FEVER GREATER THAN 100.5 F  *CHILLS WITH OR WITHOUT FEVER  NAUSEA AND VOMITING THAT IS NOT CONTROLLED WITH YOUR NAUSEA MEDICATION  *UNUSUAL SHORTNESS OF BREATH  *UNUSUAL BRUISING OR BLEEDING  TENDERNESS IN MOUTH AND THROAT WITH OR WITHOUT PRESENCE OF ULCERS  *URINARY PROBLEMS  *BOWEL PROBLEMS  UNUSUAL RASH Items with * indicate a potential emergency and should be followed up as soon as possible.  Feel free to call the clinic should you have any questions or concerns. The clinic phone number is [phone].  Please show the [facility] at check-in to the Emergency Department and triage nurse.

## 2018-01-21 NOTE — Progress Notes (Signed)
Received patient in the nursing clinic following radiation treatment. Today the patient received the 9th of 33 intended radiation treatments to his left lung. Patient reports new onset (since Saturday) increased flatus, indigestion and heartburn. Patient denies eating any spicy or greasy food. Patient reports he took Mylanta without relief. Patient scheduled for chemotherapy today at 1230. Encouraged patient to eat bland food such as rice, toast, apples and bananas. Patient understands this RN will discuss his new symptoms with his providers and phone him at [phone] with further directions. Patient uses [facility] on [address].

## 2018-01-21 NOTE — Telephone Encounter (Signed)
Phoned patient's cell. Explained Carafate has been escribed to his preferred pharmacy. Reviewed directions of use for Carafate with patient and he verbalized understanding. Also, as directed by Shona Simpson, PA-C I encouraged him to begin taking OTC Prilosec and Gas X prn. Patient verbalized understanding of all reviewed.

## 2018-01-22 ENCOUNTER — Ambulatory Visit: Payer: PRIVATE HEALTH INSURANCE

## 2018-01-22 ENCOUNTER — Ambulatory Visit
Admission: RE | Admit: 2018-01-22 | Discharge: 2018-01-22 | Disposition: A | Discharge status: Home / Self Care | Payer: Medicare Other | Source: Ambulatory Visit | Attending: Radiation Oncology | Admitting: Radiation Oncology

## 2018-01-22 ENCOUNTER — Other Ambulatory Visit: Payer: PRIVATE HEALTH INSURANCE

## 2018-01-22 DIAGNOSIS — Z510 Encounter for antineoplastic radiation therapy: Secondary | ICD-10-CM | POA: Diagnosis not present

## 2018-01-23 ENCOUNTER — Ambulatory Visit
Admission: RE | Admit: 2018-01-23 | Discharge: 2018-01-23 | Disposition: A | Discharge status: Home / Self Care | Payer: Medicare Other | Source: Ambulatory Visit | Attending: Radiation Oncology | Admitting: Radiation Oncology

## 2018-01-23 DIAGNOSIS — Z510 Encounter for antineoplastic radiation therapy: Secondary | ICD-10-CM | POA: Diagnosis not present

## 2018-01-24 ENCOUNTER — Telehealth: Payer: Self-pay | Admitting: *Deleted

## 2018-01-24 ENCOUNTER — Ambulatory Visit
Admission: RE | Admit: 2018-01-24 | Discharge: 2018-01-24 | Disposition: A | Discharge status: Home / Self Care | Payer: Medicare Other | Source: Ambulatory Visit | Attending: Radiation Oncology | Admitting: Radiation Oncology

## 2018-01-24 DIAGNOSIS — Z510 Encounter for antineoplastic radiation therapy: Secondary | ICD-10-CM | POA: Diagnosis not present

## 2018-01-24 NOTE — Telephone Encounter (Signed)
CALLED PATIENT TO INFORM OF NUTRITION APPT. ON 01-29-18 @ 9:45 AM WITH BARBARA NEFF, PT. IS AWARE OF THIS APPT.

## 2018-01-24 NOTE — Telephone Encounter (Signed)
XXXXX

## 2018-01-25 ENCOUNTER — Ambulatory Visit
Admission: RE | Admit: 2018-01-25 | Discharge: 2018-01-25 | Disposition: A | Discharge status: Home / Self Care | Payer: Medicare Other | Source: Ambulatory Visit | Attending: Radiation Oncology | Admitting: Radiation Oncology

## 2018-01-25 DIAGNOSIS — Z510 Encounter for antineoplastic radiation therapy: Secondary | ICD-10-CM | POA: Diagnosis not present

## 2018-01-28 ENCOUNTER — Inpatient Hospital Stay: Payer: Medicare Other | Admitting: Nurse Practitioner

## 2018-01-28 ENCOUNTER — Inpatient Hospital Stay: Payer: Medicare Other

## 2018-01-28 ENCOUNTER — Telehealth: Payer: Self-pay | Admitting: Radiation Oncology

## 2018-01-28 ENCOUNTER — Other Ambulatory Visit: Payer: Self-pay | Admitting: Radiation Oncology

## 2018-01-28 ENCOUNTER — Ambulatory Visit
Admission: RE | Admit: 2018-01-28 | Discharge: 2018-01-28 | Disposition: A | Discharge status: Home / Self Care | Payer: Medicare Other | Source: Ambulatory Visit | Attending: Radiation Oncology | Admitting: Radiation Oncology

## 2018-01-28 VITALS — BP 144/61 | HR 86 | Temp 98.0°F | Resp 19

## 2018-01-28 DIAGNOSIS — Z5111 Encounter for antineoplastic chemotherapy: Secondary | ICD-10-CM | POA: Diagnosis not present

## 2018-01-28 DIAGNOSIS — C3412 Malignant neoplasm of upper lobe, left bronchus or lung: Secondary | ICD-10-CM

## 2018-01-28 DIAGNOSIS — Z510 Encounter for antineoplastic radiation therapy: Secondary | ICD-10-CM | POA: Diagnosis not present

## 2018-01-28 DIAGNOSIS — C3492 Malignant neoplasm of unspecified part of left bronchus or lung: Secondary | ICD-10-CM

## 2018-01-28 LAB — CMP (CANCER CENTER ONLY)
ALBUMIN: 3.6 g/dL (ref 3.5–5.0)
ALT: 14 U/L (ref 0–55)
ANION GAP: 9 (ref 3–11)
AST: 19 U/L (ref 5–34)
Alkaline Phosphatase: 65 U/L (ref 40–150)
BUN: 19 mg/dL (ref 7–26)
CHLORIDE: 106 mmol/L (ref 98–109)
CO2: 22 mmol/L (ref 22–29)
Calcium: 9.3 mg/dL (ref 8.4–10.4)
Creatinine: 1.02 mg/dL (ref 0.70–1.30)
GFR, Est AFR Am: >60 mL/min (ref >60)
GFR, Estimated: >60 mL/min (ref >60)
GLUCOSE: 161 mg/dL — AB (ref 70–140)
POTASSIUM: 4 mmol/L (ref 3.5–5.1)
SODIUM: 137 mmol/L (ref 136–145)
TOTAL PROTEIN: 7 g/dL (ref 6.4–8.3)
Total Bilirubin: 0.4 mg/dL (ref 0.2–1.2)

## 2018-01-28 LAB — CBC WITH DIFFERENTIAL (CANCER CENTER ONLY)
BASOS PCT: 1 %
Basophils Absolute: 0 10*3/uL (ref 0.0–0.1)
EOS ABS: 0 10*3/uL (ref 0.0–0.5)
Eosinophils Relative: 1 %
HCT: 33.6 % — ABNORMAL LOW (ref 38.4–49.9)
Hemoglobin: 11.1 g/dL — ABNORMAL LOW (ref 13.0–17.1)
Lymphocytes Relative: 15 %
Lymphs Abs: 0.3 10*3/uL — ABNORMAL LOW (ref 0.9–3.3)
MCH: 28.4 pg (ref 27.2–33.4)
MCHC: 33 g/dL (ref 32.0–36.0)
MCV: 86.1 fL (ref 79.3–98.0)
MONO ABS: 0.2 10*3/uL (ref 0.1–0.9)
MONOS PCT: 9 %
Neutro Abs: 1.6 10*3/uL (ref 1.5–6.5)
Neutrophils Relative %: 74 %
PLATELETS: 219 10*3/uL (ref 140–400)
RBC: 3.9 MIL/uL — ABNORMAL LOW (ref 4.20–5.82)
RDW: 14.2 % (ref 11.0–14.6)
WBC Count: 2.1 10*3/uL — ABNORMAL LOW (ref 4.0–10.3)

## 2018-01-28 MED ORDER — FAMOTIDINE IN NACL 20-0.9 MG/50ML-% IV SOLN
20.0000 mg | Freq: Once | INTRAVENOUS | Status: AC
  Administered 2018-01-28: 20 mg via INTRAVENOUS

## 2018-01-28 MED ORDER — PALONOSETRON HCL INJECTION 0.25 MG/5ML
0.2500 mg | Freq: Once | INTRAVENOUS | Status: AC
  Administered 2018-01-28: 0.25 mg via INTRAVENOUS

## 2018-01-28 MED ORDER — FAMOTIDINE IN NACL 20-0.9 MG/50ML-% IV SOLN
INTRAVENOUS | Status: AC
  Filled 2018-01-28: qty 50

## 2018-01-28 MED ORDER — DIPHENHYDRAMINE HCL 50 MG/ML IJ SOLN
INTRAMUSCULAR | Status: AC
  Filled 2018-01-28: qty 1

## 2018-01-28 MED ORDER — HYDROCODONE-ACETAMINOPHEN 7.5-325 MG/15ML PO SOLN
10.0000 mL | ORAL | 0 refills | Status: DC | PRN
Start: 2018-01-28 — End: 2018-02-25

## 2018-01-28 MED ORDER — SODIUM CHLORIDE 0.9 % IV SOLN
45.0000 mg/m2 | Freq: Once | INTRAVENOUS | Status: AC
  Administered 2018-01-28: 96 mg via INTRAVENOUS
  Filled 2018-01-28: qty 16

## 2018-01-28 MED ORDER — SODIUM CHLORIDE 0.9 % IV SOLN
Freq: Once | INTRAVENOUS | Status: AC
  Administered 2018-01-28: 11:00:00 via INTRAVENOUS

## 2018-01-28 MED ORDER — PALONOSETRON HCL INJECTION 0.25 MG/5ML
INTRAVENOUS | Status: AC
  Filled 2018-01-28: qty 5

## 2018-01-28 MED ORDER — SODIUM CHLORIDE 0.9 % IV SOLN
20.0000 mg | Freq: Once | INTRAVENOUS | Status: AC
  Administered 2018-01-28: 20 mg via INTRAVENOUS
  Filled 2018-01-28: qty 2

## 2018-01-28 MED ORDER — SODIUM CHLORIDE 0.9 % IV SOLN
175.2000 mg | Freq: Once | INTRAVENOUS | Status: AC
  Administered 2018-01-28: 180 mg via INTRAVENOUS
  Filled 2018-01-28: qty 18

## 2018-01-28 MED ORDER — DIPHENHYDRAMINE HCL 50 MG/ML IJ SOLN
50.0000 mg | Freq: Once | INTRAMUSCULAR | Status: AC
  Administered 2018-01-28: 50 mg via INTRAVENOUS

## 2018-01-28 NOTE — Patient Instructions (Signed)
[facility] Discharge Instructions for Patients Receiving Chemotherapy  Today you received the following chemotherapy agents paclitaxel (Taxol) and carboplatin (Paraplatin).  To help prevent nausea and vomiting after your treatment, we encourage you to take your nausea medication .   If you develop nausea and vomiting that is not controlled by your nausea medication, call the clinic.   BELOW ARE SYMPTOMS THAT SHOULD BE REPORTED IMMEDIATELY:  *FEVER GREATER THAN 100.5 F  *CHILLS WITH OR WITHOUT FEVER  NAUSEA AND VOMITING THAT IS NOT CONTROLLED WITH YOUR NAUSEA MEDICATION  *UNUSUAL SHORTNESS OF BREATH  *UNUSUAL BRUISING OR BLEEDING  TENDERNESS IN MOUTH AND THROAT WITH OR WITHOUT PRESENCE OF ULCERS  *URINARY PROBLEMS  *BOWEL PROBLEMS  UNUSUAL RASH Items with * indicate a potential emergency and should be followed up as soon as possible.  Feel free to call the clinic should you have any questions or concerns. The clinic phone number is [phone].  Please show the [place] at check-in to the Emergency Department and triage nurse.

## 2018-01-28 NOTE — Telephone Encounter (Signed)
Phoned patient's cell. No answer. Left detailed message. Explained Hycet has been escribed by Shona Simpson, PA-C to the [facility] on [address] and should be ready for pick up. Stressed this medication can cause drowsiness and constipation. Instructed patient not to drive while taking this medication and off set potential for diarrhea with colace 100 mg bid. Also, instructed patient to continue Carafate in addition to the Hycet along with Mylanta. Provided my contact information and encouraged the patient to phone back with further questions or needs.

## 2018-01-28 NOTE — Progress Notes (Signed)
Received patient in the nursing clinic following radiation treatment. Today the patient received the 14th of 33 intended radiation treatments to his left lung. Patient scheduled for chemotherapy today at 1100. Patient reports increased esophagitis despite use of Carafate. Weight stable. Patient reports he continues to supplement with Boost. Patient scheduled for nutrition consult tomorrow with Ernestene Kiel. Encouraged patient to eat small frequency meals containing softer foods. Encouraged patient to avoid meat, bread and acidic foods. Explained drinks and food at room temperature will be easier to swallow than very cold or hot items. Patient and wife understand this RN will inform [facility] of these findings and phone them with further directions.

## 2018-01-29 ENCOUNTER — Ambulatory Visit: Payer: PRIVATE HEALTH INSURANCE

## 2018-01-29 ENCOUNTER — Inpatient Hospital Stay: Payer: Medicare Other | Admitting: Nutrition

## 2018-01-29 ENCOUNTER — Ambulatory Visit: Payer: PRIVATE HEALTH INSURANCE | Admitting: Internal Medicine

## 2018-01-29 ENCOUNTER — Other Ambulatory Visit: Payer: PRIVATE HEALTH INSURANCE

## 2018-01-29 ENCOUNTER — Ambulatory Visit
Admission: RE | Admit: 2018-01-29 | Discharge: 2018-01-29 | Disposition: A | Discharge status: Home / Self Care | Payer: Medicare Other | Source: Ambulatory Visit | Attending: Radiation Oncology | Admitting: Radiation Oncology

## 2018-01-29 DIAGNOSIS — Z510 Encounter for antineoplastic radiation therapy: Secondary | ICD-10-CM | POA: Diagnosis not present

## 2018-01-29 MED ORDER — DIPHENHYDRAMINE HCL 50 MG/ML IJ SOLN
INTRAMUSCULAR | Status: AC
  Filled 2018-01-29: qty 1

## 2018-01-29 MED ORDER — FAMOTIDINE IN NACL 20-0.9 MG/50ML-% IV SOLN
INTRAVENOUS | Status: AC
  Filled 2018-01-29: qty 50

## 2018-01-29 MED ORDER — DEXAMETHASONE SODIUM PHOSPHATE 10 MG/ML IJ SOLN
INTRAMUSCULAR | Status: AC
  Filled 2018-01-29: qty 1

## 2018-01-29 MED ORDER — PALONOSETRON HCL INJECTION 0.25 MG/5ML
INTRAVENOUS | Status: AC
  Filled 2018-01-29: qty 5

## 2018-01-29 NOTE — Progress Notes (Signed)
84 year old male diagnosed with Lung cancer. He is receiving concurrent chemoradiation. He is a patient of Dr. Julien Nordmann and Dr. Tammi Klippel.  PMH includes HTN, Hypothyroidism, Diverticulosis.  Medications include Synthroid, Compazine, and Carafate.  Labs were reviewed.  Height: 5'11". Weight: 186.6 pounds UBW: 190 pounds BMI: 26.03.  Patient reports difficulty and painful swallowing. He admits to constipation with LBM on Saturday. Denies other nutrition impact symptoms. Reports he drinks 2 Boost daily.  Nutrition Diagnosis: Unintended weight loss related to lung cancer and associated treatments as evidenced by 3.4 pounds weight loss.  Intervention: Educated patient to increase small frequent meals and snacks using high calorie and high protein foods. Encouraged weight maintenance. Continue Boost BID. Increase to Boost Plus if weight loss continues. Educated patient and wife on strategies for easier swallowing. Educated patient on strategies for improving constipation. Provided fact sheets. Answered questions and teach back method used. Provided contact information.  Monitoring, Evaluation, Goals: Patient will tolerate adequate calories and protein to maintain weight.  Next Visit: Monday, July 8, during infusion.

## 2018-01-30 ENCOUNTER — Other Ambulatory Visit: Payer: Self-pay | Admitting: *Deleted

## 2018-01-30 ENCOUNTER — Encounter: Payer: Self-pay | Admitting: *Deleted

## 2018-01-30 ENCOUNTER — Ambulatory Visit
Admission: RE | Admit: 2018-01-30 | Discharge: 2018-01-30 | Disposition: A | Discharge status: Home / Self Care | Payer: Medicare Other | Source: Ambulatory Visit | Attending: Radiation Oncology | Admitting: Radiation Oncology

## 2018-01-30 DIAGNOSIS — Z510 Encounter for antineoplastic radiation therapy: Secondary | ICD-10-CM | POA: Diagnosis not present

## 2018-01-30 NOTE — Progress Notes (Signed)
Oncology Nurse Navigator Documentation  Oncology Nurse Navigator Flowsheets 01/30/2018  Navigator Location [facility]  Navigator Encounter Type Other/I received a call from pathology that [facility] was unable to run molecular testing.  Through many calls and request, [facility] will run testing starting today or tomorrow.   Treatment Phase Treatment  Barriers/Navigation Needs Coordination of Care  Interventions Coordination of Care  Coordination of Care Other  Acuity Level 2  Time Spent with Patient 50

## 2018-01-31 ENCOUNTER — Ambulatory Visit
Admission: RE | Admit: 2018-01-31 | Discharge: 2018-01-31 | Disposition: A | Discharge status: Home / Self Care | Payer: Medicare Other | Source: Ambulatory Visit | Attending: Radiation Oncology | Admitting: Radiation Oncology

## 2018-01-31 DIAGNOSIS — Z510 Encounter for antineoplastic radiation therapy: Secondary | ICD-10-CM | POA: Diagnosis not present

## 2018-02-01 ENCOUNTER — Ambulatory Visit
Admission: RE | Admit: 2018-02-01 | Discharge: 2018-02-01 | Disposition: A | Discharge status: Home / Self Care | Payer: Medicare Other | Source: Ambulatory Visit | Attending: Radiation Oncology | Admitting: Radiation Oncology

## 2018-02-01 ENCOUNTER — Other Ambulatory Visit: Payer: Self-pay | Admitting: *Deleted

## 2018-02-01 DIAGNOSIS — C719 Malignant neoplasm of brain, unspecified: Secondary | ICD-10-CM

## 2018-02-01 DIAGNOSIS — Z510 Encounter for antineoplastic radiation therapy: Secondary | ICD-10-CM | POA: Diagnosis not present

## 2018-02-04 ENCOUNTER — Inpatient Hospital Stay: Payer: Medicare Other

## 2018-02-04 ENCOUNTER — Inpatient Hospital Stay (HOSPITAL_COMMUNITY)
Admission: EM | Admit: 2018-02-04 | Discharge: 2018-02-07 | DRG: 872 | Disposition: A | Discharge status: Home / Self Care | Payer: Medicare Other | Attending: Internal Medicine | Admitting: Internal Medicine

## 2018-02-04 ENCOUNTER — Emergency Department (HOSPITAL_COMMUNITY): Payer: Medicare Other

## 2018-02-04 ENCOUNTER — Encounter (HOSPITAL_COMMUNITY): Payer: Self-pay | Admitting: Emergency Medicine

## 2018-02-04 ENCOUNTER — Ambulatory Visit
Admission: RE | Admit: 2018-02-04 | Discharge: 2018-02-04 | Disposition: A | Discharge status: Home / Self Care | Payer: Medicare Other | Source: Ambulatory Visit | Attending: Radiation Oncology | Admitting: Radiation Oncology

## 2018-02-04 VITALS — BP 140/81 | HR 85 | Temp 97.8°F | Resp 18

## 2018-02-04 DIAGNOSIS — C3412 Malignant neoplasm of upper lobe, left bronchus or lung: Secondary | ICD-10-CM | POA: Diagnosis present

## 2018-02-04 DIAGNOSIS — Z923 Personal history of irradiation: Secondary | ICD-10-CM

## 2018-02-04 DIAGNOSIS — I10 Essential (primary) hypertension: Secondary | ICD-10-CM | POA: Diagnosis present

## 2018-02-04 DIAGNOSIS — R6510 Systemic inflammatory response syndrome (SIRS) of non-infectious origin without acute organ dysfunction: Secondary | ICD-10-CM

## 2018-02-04 DIAGNOSIS — R509 Fever, unspecified: Secondary | ICD-10-CM | POA: Diagnosis not present

## 2018-02-04 DIAGNOSIS — C3492 Malignant neoplasm of unspecified part of left bronchus or lung: Secondary | ICD-10-CM | POA: Diagnosis present

## 2018-02-04 DIAGNOSIS — Z833 Family history of diabetes mellitus: Secondary | ICD-10-CM

## 2018-02-04 DIAGNOSIS — Z9221 Personal history of antineoplastic chemotherapy: Secondary | ICD-10-CM

## 2018-02-04 DIAGNOSIS — A419 Sepsis, unspecified organism: Secondary | ICD-10-CM | POA: Diagnosis not present

## 2018-02-04 DIAGNOSIS — Z8042 Family history of malignant neoplasm of prostate: Secondary | ICD-10-CM

## 2018-02-04 DIAGNOSIS — H409 Unspecified glaucoma: Secondary | ICD-10-CM | POA: Diagnosis present

## 2018-02-04 DIAGNOSIS — M1990 Unspecified osteoarthritis, unspecified site: Secondary | ICD-10-CM | POA: Diagnosis present

## 2018-02-04 DIAGNOSIS — E039 Hypothyroidism, unspecified: Secondary | ICD-10-CM | POA: Diagnosis present

## 2018-02-04 DIAGNOSIS — D649 Anemia, unspecified: Secondary | ICD-10-CM | POA: Diagnosis present

## 2018-02-04 DIAGNOSIS — Z79890 Hormone replacement therapy (postmenopausal): Secondary | ICD-10-CM

## 2018-02-04 DIAGNOSIS — D72819 Decreased white blood cell count, unspecified: Secondary | ICD-10-CM | POA: Diagnosis present

## 2018-02-04 DIAGNOSIS — K5790 Diverticulosis of intestine, part unspecified, without perforation or abscess without bleeding: Secondary | ICD-10-CM | POA: Diagnosis present

## 2018-02-04 LAB — CBC WITH DIFFERENTIAL (CANCER CENTER ONLY)
Basophils Absolute: 0 10*3/uL (ref 0.0–0.1)
Basophils Relative: 1 %
EOS PCT: 1 %
Eosinophils Absolute: 0 10*3/uL (ref 0.0–0.5)
HCT: 31.9 % — ABNORMAL LOW (ref 38.4–49.9)
Hemoglobin: 10.5 g/dL — ABNORMAL LOW (ref 13.0–17.1)
LYMPHS ABS: 0.7 10*3/uL — AB (ref 0.9–3.3)
LYMPHS PCT: 19 %
MCH: 28.5 pg (ref 27.2–33.4)
MCHC: 33 g/dL (ref 32.0–36.0)
MCV: 86.3 fL (ref 79.3–98.0)
MONO ABS: 0.3 10*3/uL (ref 0.1–0.9)
MONOS PCT: 8 %
Neutro Abs: 2.5 10*3/uL (ref 1.5–6.5)
Neutrophils Relative %: 71 %
PLATELETS: 188 10*3/uL (ref 140–400)
RBC: 3.7 MIL/uL — ABNORMAL LOW (ref 4.20–5.82)
RDW: 14.8 % — AB (ref 11.0–14.6)
WBC Count: 3.5 10*3/uL — ABNORMAL LOW (ref 4.0–10.3)

## 2018-02-04 LAB — CMP (CANCER CENTER ONLY)
ALBUMIN: 3.4 g/dL — AB (ref 3.5–5.0)
ALT: 18 U/L (ref 0–55)
AST: 18 U/L (ref 5–34)
Alkaline Phosphatase: 65 U/L (ref 40–150)
Anion gap: 9 (ref 3–11)
BILIRUBIN TOTAL: 0.5 mg/dL (ref 0.2–1.2)
BUN: 18 mg/dL (ref 7–26)
CHLORIDE: 103 mmol/L (ref 98–109)
CO2: 24 mmol/L (ref 22–29)
Calcium: 9.1 mg/dL (ref 8.4–10.4)
Creatinine: 0.95 mg/dL (ref 0.70–1.30)
GFR, Est AFR Am: >60 mL/min (ref >60)
GFR, Estimated: >60 mL/min (ref >60)
GLUCOSE: 123 mg/dL (ref 70–140)
Potassium: 4.5 mmol/L (ref 3.5–5.1)
Sodium: 136 mmol/L (ref 136–145)
TOTAL PROTEIN: 6.8 g/dL (ref 6.4–8.3)

## 2018-02-04 MED ORDER — CARBOPLATIN CHEMO INJECTION 450 MG/45ML
175.2000 mg | Freq: Once | INTRAVENOUS | Status: AC
  Administered 2018-02-04: 180 mg via INTRAVENOUS
  Filled 2018-02-04: qty 18

## 2018-02-04 MED ORDER — SODIUM CHLORIDE 0.9 % IV SOLN
20.0000 mg | Freq: Once | INTRAVENOUS | Status: AC
  Administered 2018-02-04: 20 mg via INTRAVENOUS
  Filled 2018-02-04: qty 2

## 2018-02-04 MED ORDER — FAMOTIDINE IN NACL 20-0.9 MG/50ML-% IV SOLN
INTRAVENOUS | Status: AC
Start: 2018-02-04 — End: ?
  Filled 2018-02-04: qty 50

## 2018-02-04 MED ORDER — PALONOSETRON HCL INJECTION 0.25 MG/5ML
0.2500 mg | Freq: Once | INTRAVENOUS | Status: AC
Start: 2018-02-04 — End: 2018-02-04
  Administered 2018-02-04: 0.25 mg via INTRAVENOUS

## 2018-02-04 MED ORDER — SODIUM CHLORIDE 0.9 % IV BOLUS
1000.0000 mL | Freq: Once | INTRAVENOUS | Status: AC
  Administered 2018-02-05: 1000 mL via INTRAVENOUS

## 2018-02-04 MED ORDER — ACETAMINOPHEN 325 MG PO TABS: 650.0000 mg | ORAL_TABLET | Freq: Once | ORAL | Status: DC

## 2018-02-04 MED ORDER — SODIUM CHLORIDE 0.9 % IV SOLN
45.0000 mg/m2 | Freq: Once | INTRAVENOUS | Status: AC
  Administered 2018-02-04: 96 mg via INTRAVENOUS
  Filled 2018-02-04: qty 16

## 2018-02-04 MED ORDER — SODIUM CHLORIDE 0.9 % IV SOLN
Freq: Once | INTRAVENOUS | Status: AC
Start: 2018-02-04 — End: 2018-02-04
  Administered 2018-02-04: 13:00:00 via INTRAVENOUS

## 2018-02-04 MED ORDER — IBUPROFEN 200 MG PO TABS
400.0000 mg | ORAL_TABLET | Freq: Once | ORAL | Status: AC
  Administered 2018-02-05: 400 mg via ORAL
  Filled 2018-02-04: qty 2

## 2018-02-04 MED ORDER — DIPHENHYDRAMINE HCL 50 MG/ML IJ SOLN
INTRAMUSCULAR | Status: AC
  Filled 2018-02-04: qty 1

## 2018-02-04 MED ORDER — PALONOSETRON HCL INJECTION 0.25 MG/5ML
INTRAVENOUS | Status: AC
Start: 2018-02-04 — End: ?
  Filled 2018-02-04: qty 5

## 2018-02-04 MED ORDER — FAMOTIDINE IN NACL 20-0.9 MG/50ML-% IV SOLN
20.0000 mg | Freq: Once | INTRAVENOUS | Status: AC
  Administered 2018-02-04: 20 mg via INTRAVENOUS

## 2018-02-04 MED ORDER — DIPHENHYDRAMINE HCL 50 MG/ML IJ SOLN
50.0000 mg | Freq: Once | INTRAMUSCULAR | Status: AC
  Administered 2018-02-04: 50 mg via INTRAVENOUS

## 2018-02-04 NOTE — Patient Instructions (Signed)
[facility] Discharge Instructions for Patients Receiving Chemotherapy  Today you received the following chemotherapy agents paclitaxel (Taxol) and carboplatin (Paraplatin).  To help prevent nausea and vomiting after your treatment, we encourage you to take your nausea medication .   If you develop nausea and vomiting that is not controlled by your nausea medication, call the clinic.   BELOW ARE SYMPTOMS THAT SHOULD BE REPORTED IMMEDIATELY:  *FEVER GREATER THAN 100.5 F  *CHILLS WITH OR WITHOUT FEVER  NAUSEA AND VOMITING THAT IS NOT CONTROLLED WITH YOUR NAUSEA MEDICATION  *UNUSUAL SHORTNESS OF BREATH  *UNUSUAL BRUISING OR BLEEDING  TENDERNESS IN MOUTH AND THROAT WITH OR WITHOUT PRESENCE OF ULCERS  *URINARY PROBLEMS  *BOWEL PROBLEMS  UNUSUAL RASH Items with * indicate a potential emergency and should be followed up as soon as possible.  Feel free to call the clinic should you have any questions or concerns. The clinic phone number is [phone].  Please show the [place] at check-in to the Emergency Department and triage nurse.

## 2018-02-04 NOTE — ED Triage Notes (Signed)
Patient BIB family, had chemo today, fever at home 103. Hx lung cancer. Denies N/V/D. Denies abdominal pain. 102.5 in triage. 1000mg  tylenol at 2045.

## 2018-02-04 NOTE — ED Provider Notes (Signed)
[facility] DEPT Provider Note: Frank Spurling, MD, FACEP  CSN: [id number] MRN: [id number] ARRIVAL: 02/04/18 at 2259 ROOM: WA23/WA23   CHIEF COMPLAINT  Fever   HISTORY OF PRESENT ILLNESS  02/04/18 11:22 PM Frank Wood is a 84 y.o. male currently undergoing chemotherapy and radiation therapy, last treatment today, for squamous cell carcinoma of the left lung.  He is here with a fever and chills at home but came on suddenly about 8 PM.  His temperature was 103 at home and he was given Tylenol.  He was noted to have a temperature of 102.5.  He denies nausea, vomiting, diarrhea or abdominal pain.  He has had generalized weakness since beginning chemotherapy but the weakness is acutely worse this evening.    Past Medical History:  Diagnosis Date  . Arthritis   . Diverticulosis   . Glaucoma   . Hypertension   . Hypothyroidism     Past Surgical History:  Procedure Laterality Date  . BACK SURGERY      Family History  Problem Relation Age of Onset  . Diabetes Mother   . Diabetes Sister   . Prostate cancer Brother   . Colon cancer Neg Hx   . Stomach cancer Neg Hx   . Esophageal cancer Neg Hx     Social History   Tobacco Use  . Smoking status: Never Smoker  . Smokeless tobacco: Never Used  Substance Use Topics  . Alcohol use: No  . Drug use: No    Prior to Admission medications   Medication Sig Start Date End Date Taking? Authorizing Provider  aspirin EC 81 MG tablet Take 81 mg by mouth daily.   Yes [provider]  benazepril (LOTENSIN) 20 MG tablet Take 20 mg by mouth daily.  11/10/17  Yes [provider]  gabapentin (NEURONTIN) 100 MG capsule Take 100 mg by mouth daily as needed (pain).    Yes [provider]  gemfibrozil (LOPID) 600 MG tablet Take 600 mg by mouth daily.   Yes [provider]  HYDROcodone-acetaminophen (HYCET) 7.5-325 mg/15 ml solution Take 10-15 mLs by mouth every 4 (four) hours as needed for moderate  pain. 01/28/18  Yes Hayden Pedro, PA-C  levothyroxine (SYNTHROID, LEVOTHROID) 150 MCG tablet Take 150 mcg by mouth daily before breakfast.  11/05/17  Yes [provider]  meloxicam (MOBIC) 15 MG tablet Take 15 mg by mouth daily.   Yes [provider]  prochlorperazine (COMPAZINE) 10 MG tablet Take 1 tablet (10 mg total) by mouth every 6 (six) hours as needed for nausea or vomiting. 12/27/17  Yes Curt Bears, MD  sucralfate (CARAFATE) 1 g tablet Take 1 tablet (1 g total) by mouth 4 (four) times daily -  with meals and at bedtime. Crush and mix in 1 oz of water 01/21/18  Yes Hayden Pedro, PA-C  zolpidem (AMBIEN) 10 MG tablet Take 10 mg by mouth at bedtime as needed for sleep.   Yes [provider]  tamsulosin (FLOMAX) 0.4 MG CAPS capsule Take 0.4 mg by mouth at bedtime.     [provider]    Allergies Patient has no known allergies.   REVIEW OF SYSTEMS  Negative except as noted here or in the History of Present Illness.   PHYSICAL EXAMINATION  Initial Vital Signs Blood pressure (!) 123/56, pulse (!) 124, temperature (!) 102.5 F (39.2 C), temperature source Oral, resp. rate 16, height 5\' 10"  (1.778 m), weight 83.9 kg (185 lb), SpO2 96 %.  Examination General: Well-developed, well-nourished male in no acute distress; appearance consistent with age of record HENT: normocephalic; atraumatic Eyes: pupils equal, round and reactive to light; extraocular muscles intact Neck: supple Heart: regular rate and rhythm; tachycardia Lungs: Decreased sounds right base; tachypneic Abdomen: soft; nondistended; nontender; bowel sounds present Extremities: No deformity; full range of motion; pulses normal Neurologic: Awake, alert and oriented; motor function intact in all extremities and symmetric; no facial droop Skin: Warm and dry Psychiatric: Normal mood and affect   RESULTS  Summary of this visit's results, reviewed by myself:   EKG  Interpretation  Date/Time:    Ventricular Rate:    PR Interval:    QRS Duration:   QT Interval:    QTC Calculation:   R Axis:     Text Interpretation:        Laboratory Studies: Results for orders placed or performed during the hospital encounter of 02/04/18 (from the past 24 hour(s))  Comprehensive metabolic panel     Status: Abnormal   Collection Time: 02/04/18 11:10 PM  Result Value Ref Range   Sodium 134 (L) 135 - 145 mmol/L   Potassium 4.3 3.5 - 5.1 mmol/L   Chloride 105 98 - 111 mmol/L   CO2 21 (L) 22 - 32 mmol/L   Glucose, Bld 197 (H) 70 - 99 mg/dL   BUN 27 (H) 8 - 23 mg/dL   Creatinine, Ser 1.13 0.61 - 1.24 mg/dL   Calcium 8.5 (L) 8.9 - 10.3 mg/dL   Total Protein 6.6 6.5 - 8.1 g/dL   Albumin 3.2 (L) 3.5 - 5.0 g/dL   AST 28 15 - 41 U/L   ALT 24 0 - 44 U/L   Alkaline Phosphatase 58 38 - 126 U/L   Total Bilirubin 0.5 0.3 - 1.2 mg/dL   GFR calc non Af Amer 58 (L) >60 mL/min   GFR calc Af Amer >60 >60 mL/min   Anion gap 8 5 - 15  CBC with Differential     Status: Abnormal   Collection Time: 02/04/18 11:10 PM  Result Value Ref Range   WBC 3.6 (L) 4.0 - 10.5 K/uL   RBC 3.48 (L) 4.22 - 5.81 MIL/uL   Hemoglobin 9.7 (L) 13.0 - 17.0 g/dL   HCT 30.7 (L) 39.0 - 52.0 %   MCV 88.2 78.0 - 100.0 fL   MCH 27.9 26.0 - 34.0 pg   MCHC 31.6 30.0 - 36.0 g/dL   RDW 15.0 11.5 - 15.5 %   Platelets 186 150 - 400 K/uL   Neutrophils Relative % 95 %   Neutro Abs 3.5 1.7 - 7.7 K/uL   Lymphocytes Relative 3 %   Lymphs Abs 0.1 (L) 0.7 - 4.0 K/uL   Monocytes Relative 2 %   Monocytes Absolute 0.1 0.1 - 1.0 K/uL   Eosinophils Relative 0 %   Eosinophils Absolute 0.0 0.0 - 0.7 K/uL   Basophils Relative 0 %   Basophils Absolute 0.0 0.0 - 0.1 K/uL  Protime-INR     Status: None   Collection Time: 02/04/18 11:10 PM  Result Value Ref Range   Prothrombin Time 14.6 11.4 - 15.2 seconds   INR 1.15   Urinalysis, Routine w reflex microscopic     Status: None   Collection Time: 02/04/18 11:10 PM   Result Value Ref Range   Color, Urine YELLOW YELLOW   APPearance CLEAR CLEAR   Specific Gravity, Urine 1.012 1.005 - 1.030   pH 5.0 5.0 - 8.0   Glucose,  UA NEGATIVE NEGATIVE mg/dL   Hgb urine dipstick NEGATIVE NEGATIVE   Bilirubin Urine NEGATIVE NEGATIVE   Ketones, ur NEGATIVE NEGATIVE mg/dL   Protein, ur NEGATIVE NEGATIVE mg/dL   Nitrite NEGATIVE NEGATIVE   Leukocytes, UA NEGATIVE NEGATIVE  I-Stat CG4 Lactic Acid, ED     Status: Abnormal   Collection Time: 02/05/18 12:21 AM  Result Value Ref Range   Lactic Acid, Venous 2.79 (HH) 0.5 - 1.9 mmol/L   Comment NOTIFIED PHYSICIAN    Imaging Studies: Dg Chest 2 View  Result Date: 02/05/2018 CLINICAL DATA:  84 year old male with history of lung cancer. Patient had chemo treatment today. Fever. EXAM: CHEST - 2 VIEW COMPARISON:  PET-CT dated 01/03/2018 FINDINGS: There is a 5.2 x 3.4 cm left perihilar density corresponding to the known mass/neoplasm. No new consolidative changes. There is no pleural effusion or pneumothorax. The cardiac silhouette is within normal limits. No acute osseous pathology. IMPRESSION: Left perihilar mass.  No new consolidative changes. Electronically Signed   By: Anner Crete M.D.   On: 02/05/2018 01:06    ED COURSE and MDM  Nursing notes and initial vitals signs, including pulse oximetry, reviewed.  Vitals:   02/05/18 0030 02/05/18 0100 02/05/18 0221 02/05/18 0255  BP: (!) 103/52 (!) 103/51 (!) 101/52 (!) 94/47  Pulse: (!) 107 (!) 105 (!) 105 (!) 104  Resp: 20 (!) 21 (!) 24 (!) 24  Temp:   99.2 F (37.3 C)   TempSrc:   Oral   SpO2: 95% 95% 95% 93%  Weight:      Height:       11:39 PM Sepsis protocol initiated.  IV fluid bolus initiated.  Patient given 400 mg of ibuprofen for his fever.   3:01 AM Zosyn and vancomycin ordered for suspected sepsis without obvious source.  Patient's blood pressure dropped to 94/40 but is currently 112/52. Additional 1 L bolus ordered.  [facility] consulted for  admission.  PROCEDURES    ED DIAGNOSES     ICD-10-CM   1. SIRS (systemic inflammatory response syndrome) ([place]) R65.10        Shanah Guimaraes, MD 02/05/18 [phone]

## 2018-02-05 ENCOUNTER — Other Ambulatory Visit: Payer: Self-pay

## 2018-02-05 ENCOUNTER — Ambulatory Visit: Payer: PRIVATE HEALTH INSURANCE

## 2018-02-05 ENCOUNTER — Other Ambulatory Visit: Payer: PRIVATE HEALTH INSURANCE

## 2018-02-05 ENCOUNTER — Encounter (HOSPITAL_COMMUNITY): Payer: Self-pay

## 2018-02-05 ENCOUNTER — Ambulatory Visit: Payer: Medicare Other

## 2018-02-05 DIAGNOSIS — Z9221 Personal history of antineoplastic chemotherapy: Secondary | ICD-10-CM | POA: Diagnosis not present

## 2018-02-05 DIAGNOSIS — C3492 Malignant neoplasm of unspecified part of left bronchus or lung: Secondary | ICD-10-CM | POA: Diagnosis present

## 2018-02-05 DIAGNOSIS — M1990 Unspecified osteoarthritis, unspecified site: Secondary | ICD-10-CM | POA: Diagnosis present

## 2018-02-05 DIAGNOSIS — Z833 Family history of diabetes mellitus: Secondary | ICD-10-CM | POA: Diagnosis not present

## 2018-02-05 DIAGNOSIS — Z8042 Family history of malignant neoplasm of prostate: Secondary | ICD-10-CM | POA: Diagnosis not present

## 2018-02-05 DIAGNOSIS — K5790 Diverticulosis of intestine, part unspecified, without perforation or abscess without bleeding: Secondary | ICD-10-CM | POA: Diagnosis present

## 2018-02-05 DIAGNOSIS — H409 Unspecified glaucoma: Secondary | ICD-10-CM | POA: Diagnosis present

## 2018-02-05 DIAGNOSIS — C3412 Malignant neoplasm of upper lobe, left bronchus or lung: Secondary | ICD-10-CM | POA: Diagnosis not present

## 2018-02-05 DIAGNOSIS — D72819 Decreased white blood cell count, unspecified: Secondary | ICD-10-CM | POA: Diagnosis present

## 2018-02-05 DIAGNOSIS — A419 Sepsis, unspecified organism: Secondary | ICD-10-CM | POA: Diagnosis present

## 2018-02-05 DIAGNOSIS — I10 Essential (primary) hypertension: Secondary | ICD-10-CM | POA: Diagnosis present

## 2018-02-05 DIAGNOSIS — Z923 Personal history of irradiation: Secondary | ICD-10-CM | POA: Diagnosis not present

## 2018-02-05 DIAGNOSIS — R6510 Systemic inflammatory response syndrome (SIRS) of non-infectious origin without acute organ dysfunction: Secondary | ICD-10-CM | POA: Diagnosis not present

## 2018-02-05 DIAGNOSIS — E039 Hypothyroidism, unspecified: Secondary | ICD-10-CM | POA: Diagnosis present

## 2018-02-05 DIAGNOSIS — D649 Anemia, unspecified: Secondary | ICD-10-CM | POA: Diagnosis present

## 2018-02-05 DIAGNOSIS — R509 Fever, unspecified: Secondary | ICD-10-CM | POA: Diagnosis present

## 2018-02-05 DIAGNOSIS — Z79890 Hormone replacement therapy (postmenopausal): Secondary | ICD-10-CM | POA: Diagnosis not present

## 2018-02-05 LAB — COMPREHENSIVE METABOLIC PANEL
ALT: 24 U/L (ref 0–44)
ALT: 24 U/L (ref 0–44)
ANION GAP: 8 (ref 5–15)
ANION GAP: 7 (ref 5–15)
AST: 28 U/L (ref 15–41)
AST: 25 U/L (ref 15–41)
Albumin: 3.2 g/dL — ABNORMAL LOW (ref 3.5–5.0)
Albumin: 3 g/dL — ABNORMAL LOW (ref 3.5–5.0)
Alkaline Phosphatase: 58 U/L (ref 38–126)
Alkaline Phosphatase: 49 U/L (ref 38–126)
BILIRUBIN TOTAL: 0.7 mg/dL (ref 0.3–1.2)
BUN: 27 mg/dL — AB (ref 8–23)
BUN: 24 mg/dL — ABNORMAL HIGH (ref 8–23)
CHLORIDE: 105 mmol/L (ref 98–111)
CHLORIDE: 108 mmol/L (ref 98–111)
CO2: 21 mmol/L — ABNORMAL LOW (ref 22–32)
CO2: 21 mmol/L — AB (ref 22–32)
Calcium: 8.5 mg/dL — ABNORMAL LOW (ref 8.9–10.3)
Calcium: 8 mg/dL — ABNORMAL LOW (ref 8.9–10.3)
Creatinine, Ser: 1.13 mg/dL (ref 0.61–1.24)
Creatinine, Ser: 1.12 mg/dL (ref 0.61–1.24)
GFR calc Af Amer: >60 mL/min (ref >60)
GFR, EST NON AFRICAN AMERICAN: 58 mL/min — AB (ref >60)
GFR, EST NON AFRICAN AMERICAN: 59 mL/min — AB (ref >60)
GLUCOSE: 173 mg/dL — AB (ref 70–99)
Glucose, Bld: 197 mg/dL — ABNORMAL HIGH (ref 70–99)
POTASSIUM: 4.3 mmol/L (ref 3.5–5.1)
Potassium: 4.4 mmol/L (ref 3.5–5.1)
Sodium: 134 mmol/L — ABNORMAL LOW (ref 135–145)
Sodium: 136 mmol/L (ref 135–145)
Total Bilirubin: 0.5 mg/dL (ref 0.3–1.2)
Total Protein: 6.6 g/dL (ref 6.5–8.1)
Total Protein: 5.9 g/dL — ABNORMAL LOW (ref 6.5–8.1)

## 2018-02-05 LAB — CBC WITH DIFFERENTIAL/PLATELET
BASOS ABS: 0 10*3/uL (ref 0.0–0.1)
BASOS ABS: 0 10*3/uL (ref 0.0–0.1)
BASOS PCT: 0 %
Basophils Relative: 0 %
Eosinophils Absolute: 0 10*3/uL (ref 0.0–0.7)
Eosinophils Absolute: 0 10*3/uL (ref 0.0–0.7)
Eosinophils Relative: 0 %
Eosinophils Relative: 1 %
HCT: 30.7 % — ABNORMAL LOW (ref 39.0–52.0)
HEMATOCRIT: 29.8 % — AB (ref 39.0–52.0)
HEMOGLOBIN: 9.7 g/dL — AB (ref 13.0–17.0)
Hemoglobin: 9.7 g/dL — ABNORMAL LOW (ref 13.0–17.0)
LYMPHS PCT: 3 %
Lymphocytes Relative: 3 %
Lymphs Abs: 0.1 10*3/uL — ABNORMAL LOW (ref 0.7–4.0)
Lymphs Abs: 0.1 10*3/uL — ABNORMAL LOW (ref 0.7–4.0)
MCH: 27.9 pg (ref 26.0–34.0)
MCH: 28.5 pg (ref 26.0–34.0)
MCHC: 31.6 g/dL (ref 30.0–36.0)
MCHC: 32.6 g/dL (ref 30.0–36.0)
MCV: 88.2 fL (ref 78.0–100.0)
MCV: 87.6 fL (ref 78.0–100.0)
Monocytes Absolute: 0.1 10*3/uL (ref 0.1–1.0)
Monocytes Absolute: 0.1 10*3/uL (ref 0.1–1.0)
Monocytes Relative: 2 %
Monocytes Relative: 2 %
NEUTROS ABS: 3.5 10*3/uL (ref 1.7–7.7)
NEUTROS PCT: 95 %
Neutro Abs: 3.8 10*3/uL (ref 1.7–7.7)
Neutrophils Relative %: 94 %
Platelets: 186 10*3/uL (ref 150–400)
Platelets: 155 10*3/uL (ref 150–400)
RBC: 3.48 MIL/uL — AB (ref 4.22–5.81)
RBC: 3.4 MIL/uL — AB (ref 4.22–5.81)
RDW: 15 % (ref 11.5–15.5)
RDW: 15.3 % (ref 11.5–15.5)
WBC: 3.6 10*3/uL — AB (ref 4.0–10.5)
WBC: 4 10*3/uL (ref 4.0–10.5)

## 2018-02-05 LAB — URINALYSIS, ROUTINE W REFLEX MICROSCOPIC
Bilirubin Urine: NEGATIVE
Glucose, UA: NEGATIVE mg/dL
Hgb urine dipstick: NEGATIVE
KETONES UR: NEGATIVE mg/dL
LEUKOCYTES UA: NEGATIVE
Nitrite: NEGATIVE
PROTEIN: NEGATIVE mg/dL
Specific Gravity, Urine: 1.012 (ref 1.005–1.030)
pH: 5 (ref 5.0–8.0)

## 2018-02-05 LAB — PROTIME-INR
INR: 1.15
Prothrombin Time: 14.6 seconds (ref 11.4–15.2)

## 2018-02-05 LAB — I-STAT CG4 LACTIC ACID, ED
LACTIC ACID, VENOUS: 1.5 mmol/L (ref 0.5–1.9)
LACTIC ACID, VENOUS: 2.79 mmol/L — AB (ref 0.5–1.9)

## 2018-02-05 LAB — MAGNESIUM: Magnesium: 1.7 mg/dL (ref 1.7–2.4)

## 2018-02-05 MED ORDER — PROCHLORPERAZINE MALEATE 10 MG PO TABS: 10.0000 mg | ORAL_TABLET | Freq: Four times a day (QID) | ORAL | Status: DC | PRN

## 2018-02-05 MED ORDER — ZOLPIDEM TARTRATE 5 MG PO TABS
5.0000 mg | ORAL_TABLET | Freq: Every evening | ORAL | Status: DC | PRN
  Administered 2018-02-05 – 2018-02-06 (×2): 5 mg via ORAL
  Filled 2018-02-05 (×2): qty 1

## 2018-02-05 MED ORDER — ACETAMINOPHEN 650 MG RE SUPP: 650.0000 mg | Freq: Four times a day (QID) | RECTAL | Status: DC | PRN

## 2018-02-05 MED ORDER — VANCOMYCIN HCL IN DEXTROSE 750-5 MG/150ML-% IV SOLN
750.0000 mg | Freq: Once | INTRAVENOUS | Status: AC
  Administered 2018-02-05: 750 mg via INTRAVENOUS
  Filled 2018-02-05: qty 150

## 2018-02-05 MED ORDER — VANCOMYCIN HCL 10 G IV SOLR
1250.0000 mg | INTRAVENOUS | Status: DC
  Administered 2018-02-06 – 2018-02-07 (×2): 1250 mg via INTRAVENOUS
  Filled 2018-02-05 (×2): qty 1250

## 2018-02-05 MED ORDER — MELOXICAM 15 MG PO TABS
15.0000 mg | ORAL_TABLET | Freq: Every day | ORAL | Status: DC
  Administered 2018-02-05: 15 mg via ORAL
  Filled 2018-02-05: qty 1

## 2018-02-05 MED ORDER — VANCOMYCIN HCL IN DEXTROSE 1-5 GM/200ML-% IV SOLN
1000.0000 mg | Freq: Once | INTRAVENOUS | Status: AC
  Administered 2018-02-05: 1000 mg via INTRAVENOUS
  Filled 2018-02-05: qty 200

## 2018-02-05 MED ORDER — TAMSULOSIN HCL 0.4 MG PO CAPS
0.4000 mg | ORAL_CAPSULE | Freq: Every day | ORAL | Status: DC
  Administered 2018-02-05 – 2018-02-06 (×2): 0.4 mg via ORAL
  Filled 2018-02-05 (×2): qty 1

## 2018-02-05 MED ORDER — ACETAMINOPHEN 325 MG PO TABS
650.0000 mg | ORAL_TABLET | Freq: Four times a day (QID) | ORAL | Status: DC | PRN
Start: 2018-02-05 — End: 2018-02-07

## 2018-02-05 MED ORDER — HYDROCODONE-ACETAMINOPHEN 7.5-325 MG/15ML PO SOLN: 10.0000 mL | ORAL | Status: DC | PRN

## 2018-02-05 MED ORDER — PIPERACILLIN-TAZOBACTAM 3.375 G IVPB
3.3750 g | Freq: Three times a day (TID) | INTRAVENOUS | Status: DC
  Administered 2018-02-05 – 2018-02-07 (×7): 3.375 g via INTRAVENOUS
  Filled 2018-02-05 (×6): qty 50

## 2018-02-05 MED ORDER — PIPERACILLIN-TAZOBACTAM 3.375 G IVPB 30 MIN
3.3750 g | Freq: Once | INTRAVENOUS | Status: AC
  Administered 2018-02-05: 3.375 g via INTRAVENOUS
  Filled 2018-02-05: qty 50

## 2018-02-05 MED ORDER — ASPIRIN EC 81 MG PO TBEC
81.0000 mg | DELAYED_RELEASE_TABLET | Freq: Every day | ORAL | Status: DC
  Administered 2018-02-05 – 2018-02-07 (×3): 81 mg via ORAL
  Filled 2018-02-05 (×3): qty 1

## 2018-02-05 MED ORDER — ONDANSETRON HCL 4 MG PO TABS
4.0000 mg | ORAL_TABLET | Freq: Four times a day (QID) | ORAL | Status: DC | PRN
Start: 2018-02-05 — End: 2018-02-07
  Administered 2018-02-06: 4 mg via ORAL
  Filled 2018-02-05: qty 1

## 2018-02-05 MED ORDER — ENSURE ENLIVE PO LIQD
237.0000 mL | Freq: Two times a day (BID) | ORAL | Status: DC
  Administered 2018-02-05 – 2018-02-07 (×3): 237 mL via ORAL

## 2018-02-05 MED ORDER — ONDANSETRON HCL 4 MG/2ML IJ SOLN: 4.0000 mg | Freq: Four times a day (QID) | INTRAMUSCULAR | Status: DC | PRN

## 2018-02-05 MED ORDER — SODIUM CHLORIDE 0.9 % IV BOLUS
1000.0000 mL | Freq: Once | INTRAVENOUS | Status: AC
  Administered 2018-02-05: 1000 mL via INTRAVENOUS

## 2018-02-05 MED ORDER — LEVOTHYROXINE SODIUM 150 MCG PO TABS
150.0000 ug | ORAL_TABLET | Freq: Every day | ORAL | Status: DC
  Administered 2018-02-05 – 2018-02-07 (×3): 150 ug via ORAL
  Filled 2018-02-05: qty 1
  Filled 2018-02-05 (×2): qty 3
  Filled 2018-02-05 (×2): qty 1
  Filled 2018-02-05: qty 3

## 2018-02-05 MED ORDER — ENOXAPARIN SODIUM 40 MG/0.4ML ~~LOC~~ SOLN
40.0000 mg | SUBCUTANEOUS | Status: DC
  Administered 2018-02-05 – 2018-02-07 (×3): 40 mg via SUBCUTANEOUS
  Filled 2018-02-05 (×3): qty 0.4

## 2018-02-05 MED ORDER — GEMFIBROZIL 600 MG PO TABS
600.0000 mg | ORAL_TABLET | Freq: Every day | ORAL | Status: DC
  Administered 2018-02-05 – 2018-02-07 (×3): 600 mg via ORAL
  Filled 2018-02-05 (×3): qty 1

## 2018-02-05 MED ORDER — GABAPENTIN 100 MG PO CAPS: 100.0000 mg | ORAL_CAPSULE | Freq: Every day | ORAL | Status: DC | PRN

## 2018-02-05 MED ORDER — SUCRALFATE 1 GM/10ML PO SUSP
1.0000 g | Freq: Three times a day (TID) | ORAL | Status: DC
  Administered 2018-02-05 – 2018-02-07 (×10): 1 g via ORAL
  Filled 2018-02-05 (×10): qty 10

## 2018-02-05 NOTE — ED Notes (Signed)
ED TO INPATIENT HANDOFF REPORT  Name/Age/Gender Frank Wood 84 y.o. male  Code Status    Code Status Orders  (From admission, onward)        Start     Ordered   02/05/18 0409  Full code  Continuous     02/05/18 0410    Code Status History    This patient has a current code status but no historical code status.      Home/SNF/Other Home  Chief Complaint Fever 103/ Cancer Patient   Level of Care/Admitting Diagnosis ED Disposition    ED Disposition Condition Comment   Admit  Hospital Area: [facility] [[id number]]  Level of Care: Telemetry [5]  Admit to tele based on following criteria: Complex arrhythmia (Bradycardia/Tachycardia)  Diagnosis: Sepsis [facility]) [[id number]]  Admitting Physician: Doreatha Massed  Attending Physician: Etta Quill [phone]  Estimated length of stay: past midnight tomorrow  Certification:: I certify this patient will need inpatient services for at least 2 midnights  PT Class (Do Not Modify): Inpatient [101]  PT Acc Code (Do Not Modify): Private [1]       Medical History Past Medical History:  Diagnosis Date  . Arthritis   . Diverticulosis   . Glaucoma   . Hypertension   . Hypothyroidism     Allergies No Known Allergies  IV Location/Drains/Wounds Patient Lines/Drains/Airways Status   Active Line/Drains/Airways    Name:   Placement date:   Placement time:   Site:   Days:   Peripheral IV 02/05/18 Right Antecubital   02/05/18    0020    Antecubital   less than 1   Peripheral IV 02/05/18 Right Hand   02/05/18    0020    Hand   less than 1          Labs/Imaging Results for orders placed or performed during the hospital encounter of 02/04/18 (from the past 48 hour(s))  Comprehensive metabolic panel     Status: Abnormal   Collection Time: 02/04/18 11:10 PM  Result Value Ref Range   Sodium 134 (L) 135 - 145 mmol/L   Potassium 4.3 3.5 - 5.1 mmol/L   Chloride 105 98 - 111 mmol/L   CO2 21 (L) 22 - 32  mmol/L   Glucose, Bld 197 (H) 70 - 99 mg/dL   BUN 27 (H) 8 - 23 mg/dL   Creatinine, Ser 1.13 0.61 - 1.24 mg/dL   Calcium 8.5 (L) 8.9 - 10.3 mg/dL   Total Protein 6.6 6.5 - 8.1 g/dL   Albumin 3.2 (L) 3.5 - 5.0 g/dL   AST 28 15 - 41 U/L   ALT 24 0 - 44 U/L   Alkaline Phosphatase 58 38 - 126 U/L   Total Bilirubin 0.5 0.3 - 1.2 mg/dL   GFR calc non Af Amer 58 (L) >60 mL/min   GFR calc Af Amer >60 >60 mL/min    Comment: (NOTE) The eGFR has been calculated using the CKD EPI equation. This calculation has not been validated in all clinical situations. eGFR's persistently <60 mL/min signify possible Chronic Kidney Disease.    Anion gap 8 5 - 15    Comment: Performed at [facility], [place] [address] [place], [place] [address]  CBC with Differential     Status: Abnormal   Collection Time: 02/04/18 11:10 PM  Result Value Ref Range   WBC 3.6 (L) 4.0 - 10.5 K/uL   RBC 3.48 (L) 4.22 - 5.81 MIL/uL  Hemoglobin 9.7 (L) 13.0 - 17.0 g/dL   HCT 30.7 (L) 39.0 - 52.0 %   MCV 88.2 78.0 - 100.0 fL   MCH 27.9 26.0 - 34.0 pg   MCHC 31.6 30.0 - 36.0 g/dL   RDW 15.0 11.5 - 15.5 %   Platelets 186 150 - 400 K/uL   Neutrophils Relative % 95 %   Neutro Abs 3.5 1.7 - 7.7 K/uL   Lymphocytes Relative 3 %   Lymphs Abs 0.1 (L) 0.7 - 4.0 K/uL   Monocytes Relative 2 %   Monocytes Absolute 0.1 0.1 - 1.0 K/uL   Eosinophils Relative 0 %   Eosinophils Absolute 0.0 0.0 - 0.7 K/uL   Basophils Relative 0 %   Basophils Absolute 0.0 0.0 - 0.1 K/uL    Comment: Performed at [facility], [place] [address] [place], [place] [address]  Protime-INR     Status: None   Collection Time: 02/04/18 11:10 PM  Result Value Ref Range   Prothrombin Time 14.6 11.4 - 15.2 seconds   INR 1.15     Comment: Performed at [facility], [place] [address], [place], [place] [address]  Urinalysis, Routine w reflex microscopic     Status: None   Collection Time: 02/04/18 11:10 PM  Result  Value Ref Range   Color, Urine YELLOW YELLOW   APPearance CLEAR CLEAR   Specific Gravity, Urine 1.012 1.005 - 1.030   pH 5.0 5.0 - 8.0   Glucose, UA NEGATIVE NEGATIVE mg/dL   Hgb urine dipstick NEGATIVE NEGATIVE   Bilirubin Urine NEGATIVE NEGATIVE   Ketones, ur NEGATIVE NEGATIVE mg/dL   Protein, ur NEGATIVE NEGATIVE mg/dL   Nitrite NEGATIVE NEGATIVE   Leukocytes, UA NEGATIVE NEGATIVE    Comment: Performed at [place] [address] [place],  [address]  I-Stat CG4 Lactic Acid, ED     Status: Abnormal   Collection Time: 02/05/18 12:21 AM  Result Value Ref Range   Lactic Acid, Venous 2.79 (HH) 0.5 - 1.9 mmol/L   Comment NOTIFIED PHYSICIAN   I-Stat CG4 Lactic Acid, ED     Status: None   Collection Time: 02/05/18  3:38 AM  Result Value Ref Range   Lactic Acid, Venous 1.50 0.5 - 1.9 mmol/L   Dg Chest 2 View  Result Date: 02/05/2018 CLINICAL DATA:  84 year old male with history of lung cancer. Patient had chemo treatment today. Fever. EXAM: CHEST - 2 VIEW COMPARISON:  PET-CT dated 01/03/2018 FINDINGS: There is a 5.2 x 3.4 cm left perihilar density corresponding to the known mass/neoplasm. No new consolidative changes. There is no pleural effusion or pneumothorax. The cardiac silhouette is within normal limits. No acute osseous pathology. IMPRESSION: Left perihilar mass.  No new consolidative changes. Electronically Signed   By: Anner Crete M.D.   On: 02/05/2018 01:06    Pending Labs Unresulted Labs (From admission, onward)   Start     Ordered   02/05/18 0408  C difficile quick scan w PCR reflex  (C Difficile quick screen w PCR reflex panel)  Once, for 24 hours,   R     02/05/18 0410   02/04/18 2310  Culture, blood (Routine x 2)  BLOOD CULTURE X 2,   STAT     02/04/18 2309      Vitals/Pain Today's Vitals   02/05/18 0255 02/05/18 0300 02/05/18 0330 02/05/18 0430  BP: (!) 94/47 (!) 112/52 (!) 107/57 (!) 109/58  Pulse: (!) 104 (!) 105 Marland Kitchen)  101 99   Resp: (!) 24 (!) 23 (!) 24 (!) 22  Temp:      TempSrc:      SpO2: 93% 97% 96% 96%  Weight:      Height:      PainSc:        Isolation Precautions Enteric precautions (UV disinfection)  Medications Medications  vancomycin (VANCOCIN) IVPB 1000 mg/200 mL premix (1,000 mg Intravenous New Bag/Given 02/05/18 0357)  acetaminophen (TYLENOL) tablet 650 mg (has no administration in time range)    Or  acetaminophen (TYLENOL) suppository 650 mg (has no administration in time range)  ondansetron (ZOFRAN) tablet 4 mg (has no administration in time range)    Or  ondansetron (ZOFRAN) injection 4 mg (has no administration in time range)  enoxaparin (LOVENOX) injection 40 mg (has no administration in time range)  aspirin EC tablet 81 mg (has no administration in time range)  gabapentin (NEURONTIN) capsule 100 mg (has no administration in time range)  tamsulosin (FLOMAX) capsule 0.4 mg (has no administration in time range)  zolpidem (AMBIEN) tablet 10 mg (has no administration in time range)  sucralfate (CARAFATE) tablet 1 g (has no administration in time range)  prochlorperazine (COMPAZINE) tablet 10 mg (has no administration in time range)  meloxicam (MOBIC) tablet 15 mg (has no administration in time range)  levothyroxine (SYNTHROID, LEVOTHROID) tablet 150 mcg (has no administration in time range)  HYDROcodone-acetaminophen (HYCET) 7.5-325 mg/15 ml solution 10-15 mL (has no administration in time range)  gemfibrozil (LOPID) tablet 600 mg (has no administration in time range)  sodium chloride 0.9 % bolus 1,000 mL (0 mLs Intravenous Stopped 02/05/18 0301)  ibuprofen (ADVIL,MOTRIN) tablet 400 mg (400 mg Oral Given 02/05/18 0018)  sodium chloride 0.9 % bolus 1,000 mL (0 mLs Intravenous Stopped 02/05/18 0301)  piperacillin-tazobactam (ZOSYN) IVPB 3.375 g (0 g Intravenous Stopped 02/05/18 0356)  sodium chloride 0.9 % bolus 1,000 mL (1,000 mLs Intravenous New Bag/Given 02/05/18 0329)    Mobility walks  with device

## 2018-02-05 NOTE — H&P (Signed)
History and Physical    Frank Wood [facility] ZOX:[id number] DOB: 01/19/1933 DOA: 02/04/2018  PCP: System, Pcp Not In  Patient coming from: Home  I have personally briefly reviewed patient's old medical records in [place]  Chief Complaint: Fever  HPI: Frank Wood is a 84 y.o. male with medical history significant of NSCLC stage 3 undergoing chemo and radiation therapy.  Just had chemo earlier in afternoon PTA.  Onset of fever and chills at home at ~ 8pm.  Tm 103 at home.  Took tylenol.  Presented to ED.  No nausea.  Did have diarrhea yesterday.  No abd pain.  No dysuria, no productive cough.  Has had macular skin rash for at least a week which he attributes to chemo.   ED Course: Tm 102.5.  Lactate 2.7 improves to 1.5 after IVF.  BP initially borderline low with systolic 811B to 14N, improves to 110s after 2L IVF bolus.  Empirically put on zosyn and vanc.  CXR just shows the known lung mass, UA neg.  WBC 3.6k.  Hospitalist asked to admit for sepsis of unclear origin.   Review of Systems: As per HPI otherwise 10 point review of systems negative.   Past Medical History:  Diagnosis Date  . Arthritis   . Diverticulosis   . Glaucoma   . Hypertension   . Hypothyroidism     Past Surgical History:  Procedure Laterality Date  . BACK SURGERY       reports that he has never smoked. He has never used smokeless tobacco. He reports that he does not drink alcohol or use drugs.  No Known Allergies  Family History  Problem Relation Age of Onset  . Diabetes Mother   . Diabetes Sister   . Prostate cancer Brother   . Colon cancer Neg Hx   . Stomach cancer Neg Hx   . Esophageal cancer Neg Hx      Prior to Admission medications   Medication Sig Start Date End Date Taking? Authorizing Provider  aspirin EC 81 MG tablet Take 81 mg by mouth daily.   Yes [provider]  benazepril (LOTENSIN) 20 MG tablet Take 20 mg by mouth daily.  11/10/17  Yes [provider]  gabapentin (NEURONTIN) 100 MG capsule Take 100 mg by mouth daily as needed (pain).    Yes [provider]  gemfibrozil (LOPID) 600 MG tablet Take 600 mg by mouth daily.   Yes [provider]  HYDROcodone-acetaminophen (HYCET) 7.5-325 mg/15 ml solution Take 10-15 mLs by mouth every 4 (four) hours as needed for moderate pain. 01/28/18  Yes Hayden Pedro, PA-C  levothyroxine (SYNTHROID, LEVOTHROID) 150 MCG tablet Take 150 mcg by mouth daily before breakfast.  11/05/17  Yes [provider]  meloxicam (MOBIC) 15 MG tablet Take 15 mg by mouth daily.   Yes [provider]  prochlorperazine (COMPAZINE) 10 MG tablet Take 1 tablet (10 mg total) by mouth every 6 (six) hours as needed for nausea or vomiting. 12/27/17  Yes Curt Bears, MD  sucralfate (CARAFATE) 1 g tablet Take 1 tablet (1 g total) by mouth 4 (four) times daily -  with meals and at bedtime. Crush and mix in 1 oz of water 01/21/18  Yes Hayden Pedro, PA-C  zolpidem (AMBIEN) 10 MG tablet Take 10 mg by mouth at bedtime as needed for sleep.   Yes [provider]  tamsulosin (FLOMAX) 0.4 MG CAPS capsule Take 0.4 mg by mouth at bedtime.  [provider]    Physical Exam: Vitals:   02/05/18 0221 02/05/18 0255 02/05/18 0300 02/05/18 0330  BP: (!) 101/52 (!) 94/47 (!) 112/52 (!) 107/57  Pulse: (!) 105 (!) 104 (!) 105 (!) 101  Resp: (!) 24 (!) 24 (!) 23 (!) 24  Temp: 99.2 F (37.3 C)     TempSrc: Oral     SpO2: 95% 93% 97% 96%  Weight:      Height:        Constitutional: NAD, calm, comfortable Eyes: PERRL, lids and conjunctivae normal ENMT: Mucous membranes are moist. Posterior pharynx clear of any exudate or lesions.Normal dentition.  Neck: normal, supple, no masses, no thyromegaly Respiratory: clear to auscultation bilaterally, no wheezing, no crackles. Normal respiratory effort. No accessory muscle use.  Cardiovascular: Regular rate and rhythm, no  murmurs / rubs / gallops. No extremity edema. 2+ pedal pulses. No carotid bruits.  Abdomen: no tenderness, no masses palpated. No hepatosplenomegaly. Bowel sounds positive.  Musculoskeletal: no clubbing / cyanosis. No joint deformity upper and lower extremities. Good ROM, no contractures. Normal muscle tone.  Skin: macular rash on arms, has been present for over a week, he attributes it to chemo. Neurologic: CN 2-12 grossly intact. Sensation intact, DTR normal. Strength 5/5 in all 4.  Psychiatric: Normal judgment and insight. Alert and oriented x 3. Normal mood.    Labs on Admission: I have personally reviewed following labs and imaging studies  CBC: Recent Labs  Lab 02/04/18 1031 02/04/18 2310  WBC 3.5* 3.6*  NEUTROABS 2.5 3.5  HGB 10.5* 9.7*  HCT 31.9* 30.7*  MCV 86.3 88.2  PLT 188 025   Basic Metabolic Panel: Recent Labs  Lab 02/04/18 1031 02/04/18 2310  NA 136 134*  K 4.5 4.3  CL 103 105  CO2 24 21*  GLUCOSE 123 197*  BUN 18 27*  CREATININE 0.95 1.13  CALCIUM 9.1 8.5*   GFR: Estimated Creatinine Clearance: 51.1 mL/min (by C-G formula based on SCr of 1.13 mg/dL). Liver Function Tests: Recent Labs  Lab 02/04/18 1031 02/04/18 2310  AST 18 28  ALT 18 24  ALKPHOS 65 58  BILITOT 0.5 0.5  PROT 6.8 6.6  ALBUMIN 3.4* 3.2*   No results for input(s): LIPASE, AMYLASE in the last 168 hours. No results for input(s): AMMONIA in the last 168 hours. Coagulation Profile: Recent Labs  Lab 02/04/18 2310  INR 1.15   Cardiac Enzymes: No results for input(s): CKTOTAL, CKMB, CKMBINDEX, TROPONINI in the last 168 hours. BNP (last 3 results) No results for input(s): PROBNP in the last 8760 hours. HbA1C: No results for input(s): HGBA1C in the last 72 hours. CBG: No results for input(s): GLUCAP in the last 168 hours. Lipid Profile: No results for input(s): CHOL, HDL, LDLCALC, TRIG, CHOLHDL, LDLDIRECT in the last 72 hours. Thyroid Function Tests: No results for input(s):  TSH, T4TOTAL, FREET4, T3FREE, THYROIDAB in the last 72 hours. Anemia Panel: No results for input(s): VITAMINB12, FOLATE, FERRITIN, TIBC, IRON, RETICCTPCT in the last 72 hours. Urine analysis:    Component Value Date/Time   COLORURINE YELLOW 02/04/2018 2310   APPEARANCEUR CLEAR 02/04/2018 2310   LABSPEC 1.012 02/04/2018 2310   PHURINE 5.0 02/04/2018 2310   GLUCOSEU NEGATIVE 02/04/2018 2310   HGBUR NEGATIVE 02/04/2018 2310   BILIRUBINUR NEGATIVE 02/04/2018 2310   KETONESUR NEGATIVE 02/04/2018 2310   PROTEINUR NEGATIVE 02/04/2018 2310   NITRITE NEGATIVE 02/04/2018 2310   LEUKOCYTESUR NEGATIVE 02/04/2018 2310    Radiological Exams on Admission: Dg Chest  2 View  Result Date: 02/05/2018 CLINICAL DATA:  84 year old male with history of lung cancer. Patient had chemo treatment today. Fever. EXAM: CHEST - 2 VIEW COMPARISON:  PET-CT dated 01/03/2018 FINDINGS: There is a 5.2 x 3.4 cm left perihilar density corresponding to the known mass/neoplasm. No new consolidative changes. There is no pleural effusion or pneumothorax. The cardiac silhouette is within normal limits. No acute osseous pathology. IMPRESSION: Left perihilar mass.  No new consolidative changes. Electronically Signed   By: Anner Crete M.D.   On: 02/05/2018 01:06    EKG: Independently reviewed.  Assessment/Plan Principal Problem:   Sepsis ([place]) Active Problems:   Stage III squamous cell carcinoma of left lung ([place])    1. Sepsis - 1. Unclear source 2. Did have diarrhea yesterday, checking C.Diff 3. Empiric zosyn and vanc 4. BCx pending 2. NSCLC - 1. Just got chemo yesterday 2. Epic consult to oncology  DVT prophylaxis: Lovenox Code Status: Full Family Communication: Wife at bedside Disposition Plan: Home after admit Consults called: Consult to oncology put into Epic Admission status: Admit to inpatient   Etta Quill DO [facility] Pager [phone] Only works nights!  If 7AM-7PM, please contact  the primary day team physician taking care of patient  [email or web address] Password [facility]  02/05/2018, 4:36 AM

## 2018-02-05 NOTE — Progress Notes (Signed)
Pharmacy Antibiotic Note  Frank Wood is a 84 y.o. male admitted on 02/04/2018 with sepsis.  Pharmacy has been consulted for vancomycin and zosyn dosing. Vancomycin 1 gm and zosyn 3.375 gm given in ED.   Plan: Zosyn 3.375g IV q8h (4 hour infusion).  Vancomycin 750 mg IV now to make total loading dose 1750 mg then vancomycin 1250 mg IV q24 for AUC 489 using SCr 1.13. F/u renal fxn, wbc, temp, culture data Vancomycin levels as needed  Height: 5\' 10"  (177.8 cm) Weight: 185 lb (83.9 kg) IBW/kg (Calculated) : 73  Temp (24hrs), Avg:100.9 F (38.3 C), Min:97.8 F (36.6 C), Max:102.5 F (39.2 C)  Recent Labs  Lab 02/04/18 1031 02/04/18 2310 02/05/18 0021 02/05/18 0338  WBC 3.5* 3.6*  --   --   CREATININE 0.95 1.13  --   --   LATICACIDVEN  --   --  2.79* 1.50    Estimated Creatinine Clearance: 51.1 mL/min (by C-G formula based on SCr of 1.13 mg/dL).    No Known Allergies  Antimicrobials this admission: 6/25 vanc>> 6/25 zosyn >> Dose adjustments this admission:  Microbiology results: 6/25 BCx2>> 6/25 Cdiff>>  Thank you for allowing pharmacy to be a part of this patient's care.  Eudelia Bunch, Pharm.D. [phone] 02/05/2018 4:58 AM

## 2018-02-05 NOTE — Progress Notes (Signed)
Initial Nutrition Assessment  DOCUMENTATION CODES:   Not applicable  INTERVENTION:   Ensure Enlive po BID, each supplement provides 350 kcal and 20 grams of protein  NUTRITION DIAGNOSIS:   Increased nutrient needs related to cancer and cancer related treatments as evidenced by estimated needs  GOAL:   Patient will meet greater than or equal to 90% of their needs  MONITOR:   PO intake, Supplement acceptance, Weight trends, Labs  REASON FOR ASSESSMENT:   Malnutrition Screening Tool    ASSESSMENT:   Patient with PMH significant for HTN, diverticulosis, and NSCLC stage 3 currently undergoing XRT/chemotherapy. Presents this admission with sepsis from unknown etiology.    Pt denies any loss in appetite PTA. States with radiation treatments he has developed trouble swallowing.  Reports it takes a longer time to eat foods due to this but rarely ever has choking feelings or excessive coughing. He typically eats three meals/day that consist of soft foods like soups, pudding, oatmeal, cottage cheese, fruit, yogurt, and Boost BID. Complains of small taste changes, but these have not affected his appetite. RD observed Ensure at bedside 100% completed. He ate 100% of his oatmeal and yogurt this morning. Pt is seen by outpatient [place] RD and seems to be compliant with her recommendations.   Pt endorses a UBW of 180 lb and denies any recent wt loss. Records indicate pt weighed 186 lb 01/15/18 and 187 lb this admission. Nutrition-Focused physical exam completed.  Medications reviewed and include: Carafate Labs reviewed.  NUTRITION - FOCUSED PHYSICAL EXAM:    Most Recent Value  Orbital Region  No depletion  Upper Arm Region  No depletion  Thoracic and Lumbar Region  Unable to assess  Buccal Region  No depletion  Temple Region  No depletion  Clavicle Bone Region  No depletion  Clavicle and Acromion Bone Region  No depletion  Scapular Bone Region  Unable to assess  Dorsal Hand  No  depletion  Patellar Region  No depletion  Anterior Thigh Region  No depletion  Posterior Calf Region  No depletion  Edema (RD Assessment)  None  Hair  Reviewed  Eyes  Reviewed  Mouth  Reviewed  Skin  Reviewed  Nails  Reviewed     Diet Order:   Diet Order           Diet Heart Room service appropriate? Yes; Fluid consistency: Thin  Diet effective now          EDUCATION NEEDS:   Education needs have been addressed  Skin:  Skin Assessment: Reviewed RN Assessment  Last BM:  02/03/18  Height:   Ht Readings from Last 1 Encounters:  02/05/18 5\' 10"  (1.778 m)    Weight:   Wt Readings from Last 1 Encounters:  02/05/18 187 lb 13.3 oz (85.2 kg)    Ideal Body Weight:  75.5 kg  BMI:  Body mass index is 26.95 kg/m.  Estimated Nutritional Needs:   Kcal:  2250-2450 kcal  Protein:  115-120 g  Fluid:  >2.2 L/day    Mariana Single RD, LDN Clinical Nutrition Pager # - [phone]

## 2018-02-05 NOTE — Progress Notes (Signed)
Patient ID: Frank Wood, male   DOB: 1933-01-23, 84 y.o.   MRN: [id number] Patient was admitted early this morning for fever and has been started on broad-spectrum antibiotics.  Patient had chemotherapy yesterday prior to presentation.  Patient seen and examined at bedside and plan of care discussed with him and family member at bedside.  Prior medical records and this morning's H&P reviewed by myself.  Continue broad-spectrum antibiotics.  Follow cultures.  No clear source of infection for now.  Repeat a.m. labs.  Added oncology/Dr. Julien Nordmann to care teams.

## 2018-02-05 NOTE — ED Notes (Signed)
I gave critical I Stat CG4 result to MD Molpus

## 2018-02-06 ENCOUNTER — Ambulatory Visit
Admission: RE | Admit: 2018-02-06 | Discharge: 2018-02-06 | Disposition: A | Discharge status: Home / Self Care | Payer: Medicare Other | Source: Ambulatory Visit | Attending: Radiation Oncology | Admitting: Radiation Oncology

## 2018-02-06 ENCOUNTER — Encounter (HOSPITAL_COMMUNITY): Payer: Self-pay | Admitting: *Deleted

## 2018-02-06 DIAGNOSIS — D649 Anemia, unspecified: Secondary | ICD-10-CM

## 2018-02-06 DIAGNOSIS — A419 Sepsis, unspecified organism: Principal | ICD-10-CM

## 2018-02-06 DIAGNOSIS — C3412 Malignant neoplasm of upper lobe, left bronchus or lung: Secondary | ICD-10-CM

## 2018-02-06 LAB — CBC WITH DIFFERENTIAL/PLATELET
Basophils Absolute: 0 10*3/uL (ref 0.0–0.1)
Basophils Relative: 1 %
Eosinophils Absolute: 0.1 10*3/uL (ref 0.0–0.7)
Eosinophils Relative: 2 %
HCT: 28 % — ABNORMAL LOW (ref 39.0–52.0)
HEMOGLOBIN: 9.1 g/dL — AB (ref 13.0–17.0)
LYMPHS PCT: 10 %
Lymphs Abs: 0.2 10*3/uL — ABNORMAL LOW (ref 0.7–4.0)
MCH: 28.4 pg (ref 26.0–34.0)
MCHC: 32.5 g/dL (ref 30.0–36.0)
MCV: 87.5 fL (ref 78.0–100.0)
MONO ABS: 0.2 10*3/uL (ref 0.1–1.0)
MONOS PCT: 6 %
Neutro Abs: 2 10*3/uL (ref 1.7–7.7)
Neutrophils Relative %: 81 %
Platelets: 150 10*3/uL (ref 150–400)
RBC: 3.2 MIL/uL — ABNORMAL LOW (ref 4.22–5.81)
RDW: 15.3 % (ref 11.5–15.5)
WBC: 2.5 10*3/uL — ABNORMAL LOW (ref 4.0–10.5)

## 2018-02-06 LAB — COMPREHENSIVE METABOLIC PANEL
ALK PHOS: 42 U/L (ref 38–126)
ALT: 23 U/L (ref 0–44)
AST: 25 U/L (ref 15–41)
Albumin: 2.7 g/dL — ABNORMAL LOW (ref 3.5–5.0)
Anion gap: 6 (ref 5–15)
BILIRUBIN TOTAL: 0.6 mg/dL (ref 0.3–1.2)
BUN: 21 mg/dL (ref 8–23)
CALCIUM: 8.2 mg/dL — AB (ref 8.9–10.3)
CO2: 23 mmol/L (ref 22–32)
Chloride: 105 mmol/L (ref 98–111)
Creatinine, Ser: 1 mg/dL (ref 0.61–1.24)
GFR calc Af Amer: >60 mL/min (ref >60)
Glucose, Bld: 117 mg/dL — ABNORMAL HIGH (ref 70–99)
POTASSIUM: 4.4 mmol/L (ref 3.5–5.1)
Sodium: 134 mmol/L — ABNORMAL LOW (ref 135–145)
TOTAL PROTEIN: 5.8 g/dL — AB (ref 6.5–8.1)

## 2018-02-06 LAB — MAGNESIUM: Magnesium: 1.7 mg/dL (ref 1.7–2.4)

## 2018-02-06 MED ORDER — MAGIC MOUTHWASH W/LIDOCAINE
10.0000 mL | Freq: Four times a day (QID) | ORAL | Status: DC | PRN
  Administered 2018-02-06 – 2018-02-07 (×2): 10 mL via ORAL
  Filled 2018-02-06 (×3): qty 10

## 2018-02-06 NOTE — Progress Notes (Signed)
PROGRESS NOTE    Rodolfo Gaster [facility]  OEV:[id number] DOB: Dec 13, 1932 DOA: 02/04/2018 PCP: System, Pcp Not In   Brief Narrative:  HPI on 02/05/2018 by Dr. Jennette Kettle Naythan Douthit is a 84 y.o. male with medical history significant of NSCLC stage 3 undergoing chemo and radiation therapy.  Just had chemo earlier in afternoon PTA.  Onset of fever and chills at home at ~ 8pm.  Tm 103 at home.  Took tylenol.  Presented to ED.  No nausea.  Did have diarrhea yesterday.  No abd pain.  No dysuria, no productive cough.  Has had macular skin rash for at least a week which he attributes to chemo.   Assessment & Plan   Sepsis versus SIRS -Unclear source -Presented with fever and leukopenia -Patient did have one day of diarrhea prior to admission, however has not further bowel movement -Blood cultures show no growth -UA unremarkable for infection -CXR unremarkable for infection -Placed on empiric Zosyn and vancomycin  Non-small cell lung cancer -Received chemotherapy and radiation prior to admission -Dr. 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  -will order magic mouth wash with lidocaine -treat symptomatically   Chronic normocytic anemia -Hemoglobin currently stable, continue to monitor CBC  Hypothyroidism -continue synthroid  DVT Prophylaxis  lovenox  Code Status: Full  Family Communication: Family at bedside  Disposition Plan: Admitted, pending improvement  Consultants Oncology notified of patient's admission  Procedures  None  Antibiotics   Anti-infectives (From admission, onward)   Start     Dose/Rate Route Frequency Ordered Stop   02/06/18 0600  vancomycin (VANCOCIN) 1,250 mg in sodium chloride 0.9 % 250 mL IVPB     1,250 mg 166.7 mL/hr over 90 Minutes Intravenous Every 24 hours 02/05/18 0509     02/05/18 1000  piperacillin-tazobactam (ZOSYN) IVPB 3.375 g     3.375 g 12.5 mL/hr over 240 Minutes Intravenous Every 8 hours 02/05/18  0500     02/05/18 0600  vancomycin (VANCOCIN) IVPB 750 mg/150 ml premix     750 mg 150 mL/hr over 60 Minutes Intravenous  Once 02/05/18 0457 02/05/18 0626   02/05/18 0315  piperacillin-tazobactam (ZOSYN) IVPB 3.375 g     3.375 g 100 mL/hr over 30 Minutes Intravenous  Once 02/05/18 0300 02/05/18 0356   02/05/18 0315  vancomycin (VANCOCIN) IVPB 1000 mg/200 mL premix     1,000 mg 200 mL/hr over 60 Minutes Intravenous  Once 02/05/18 0300 02/05/18 0510      Subjective:   [facility] seen and examined today.  Continues to complain of throat irritation and inability to swallow.  Denies current chest pain, shortness of breath, abdominal pain, nausea or vomiting, constipation, dizziness, headache.  Denies further diarrhea.  Objective:   Vitals:   02/05/18 2113 02/06/18 0215 02/06/18 0459 02/06/18 1331  BP:   111/61 135/76  Pulse:   85 88  Resp:   16 16  Temp: 98.2 F (36.8 C) 98.5 F (36.9 C) 98.9 F (37.2 C) 97.8 F (36.6 C)  TempSrc: Oral Oral Oral Oral  SpO2:   98% 98%  Weight:      Height:        Intake/Output Summary (Last 24 hours) at 02/06/2018 1518 Last data filed at 02/06/2018 0600 Gross per 24 hour  Intake 80.83 ml  Output -  Net 80.83 ml   Filed Weights   02/04/18 2310 02/05/18 0615  Weight: 83.9 kg (185 lb) 85.2 kg (187 lb 13.3 oz)  Exam  General: Well developed, well nourished, NAD, appears stated age  38: NCAT, mucous membranes moist.   Neck: Supple  Cardiovascular: S1 S2 auscultated, no rubs, murmurs or gallops. Regular rate and rhythm.  Respiratory: Clear to auscultation bilaterally with equal chest rise  Abdomen: Soft, nontender, nondistended, + bowel sounds  Extremities: warm dry without cyanosis clubbing or edema  Neuro: AAOx3, nonfocal   Skin: Without exudates or nodules, macular rash   Psych: Normal affect and demeanor with intact judgement and insight   Data Reviewed: I have personally reviewed following labs and imaging  studies  CBC: Recent Labs  Lab 02/04/18 1031 02/04/18 2310 02/05/18 0813 02/06/18 0414  WBC 3.5* 3.6* 4.0 2.5*  NEUTROABS 2.5 3.5 3.8 2.0  HGB 10.5* 9.7* 9.7* 9.1*  HCT 31.9* 30.7* 29.8* 28.0*  MCV 86.3 88.2 87.6 87.5  PLT 188 186 155 502   Basic Metabolic Panel: Recent Labs  Lab 02/04/18 1031 02/04/18 2310 02/05/18 0813 02/06/18 0414  NA 136 134* 136 134*  K 4.5 4.3 4.4 4.4  CL 103 105 108 105  CO2 24 21* 21* 23  GLUCOSE 123 197* 173* 117*  BUN 18 27* 24* 21  CREATININE 0.95 1.13 1.12 1.00  CALCIUM 9.1 8.5* 8.0* 8.2*  MG  --   --  1.7 1.7   GFR: Estimated Creatinine Clearance: 57.8 mL/min (by C-G formula based on SCr of 1 mg/dL). Liver Function Tests: Recent Labs  Lab 02/04/18 1031 02/04/18 2310 02/05/18 0813 02/06/18 0414  AST 18 28 25 25   ALT 18 24 24 23   ALKPHOS 65 58 49 42  BILITOT 0.5 0.5 0.7 0.6  PROT 6.8 6.6 5.9* 5.8*  ALBUMIN 3.4* 3.2* 3.0* 2.7*   No results for input(s): LIPASE, AMYLASE in the last 168 hours. No results for input(s): AMMONIA in the last 168 hours. Coagulation Profile: Recent Labs  Lab 02/04/18 2310  INR 1.15   Cardiac Enzymes: No results for input(s): CKTOTAL, CKMB, CKMBINDEX, TROPONINI in the last 168 hours. BNP (last 3 results) No results for input(s): PROBNP in the last 8760 hours. HbA1C: No results for input(s): HGBA1C in the last 72 hours. CBG: No results for input(s): GLUCAP in the last 168 hours. Lipid Profile: No results for input(s): CHOL, HDL, LDLCALC, TRIG, CHOLHDL, LDLDIRECT in the last 72 hours. Thyroid Function Tests: No results for input(s): TSH, T4TOTAL, FREET4, T3FREE, THYROIDAB in the last 72 hours. Anemia Panel: No results for input(s): VITAMINB12, FOLATE, FERRITIN, TIBC, IRON, RETICCTPCT in the last 72 hours. Urine analysis:    Component Value Date/Time   COLORURINE YELLOW 02/04/2018 [place] 02/04/2018 2310   LABSPEC 1.012 02/04/2018 2310   PHURINE 5.0 02/04/2018 2310    GLUCOSEU NEGATIVE 02/04/2018 2310   HGBUR NEGATIVE 02/04/2018 2310   BILIRUBINUR NEGATIVE 02/04/2018 2310   KETONESUR NEGATIVE 02/04/2018 2310   PROTEINUR NEGATIVE 02/04/2018 2310   NITRITE NEGATIVE 02/04/2018 2310   LEUKOCYTESUR NEGATIVE 02/04/2018 2310   Sepsis Labs: @LABRCNTIP (procalcitonin:4,lacticidven:4)  ) Recent Results (from the past 240 hour(s))  Culture, blood (Routine x 2)     Status: None (Preliminary result)   Collection Time: 02/05/18 12:05 AM  Result Value Ref Range Status   Specimen Description   Final    BLOOD RIGHT ANTECUBITAL Performed at [facility], [place] [address] [place], [place] [address]    Special Requests   Final    BOTTLES DRAWN AEROBIC AND ANAEROBIC Blood Culture adequate volume Performed at [facility],  [place] [address], [place], [place] [address]    Culture   Final    NO GROWTH 1 DAY Performed at [facility], [place] [address], [place], [place] [address]    Report Status PENDING  Incomplete  Culture, blood (Routine x 2)     Status: None (Preliminary result)   Collection Time: 02/05/18 12:08 AM  Result Value Ref Range Status   Specimen Description   Final    BLOOD RIGHT HAND Performed at [place] [address], [place], [place] [address]    Special Requests   Final    BOTTLES DRAWN AEROBIC AND ANAEROBIC Blood Culture adequate volume Performed at [place] [address], [place], [place] [address]    Culture   Final    NO GROWTH 1 DAY Performed at [facility], [place] [address] [place], [place] [address]    Report Status PENDING  Incomplete      Radiology Studies: Dg Chest 2 View  Result Date: 02/05/2018 CLINICAL DATA:  84 year old male with history of lung cancer. Patient had chemo treatment today. Fever. EXAM: CHEST - 2 VIEW COMPARISON:  PET-CT dated 01/03/2018 FINDINGS: There is a 5.2 x 3.4 cm left perihilar density corresponding to the  known mass/neoplasm. No new consolidative changes. There is no pleural effusion or pneumothorax. The cardiac silhouette is within normal limits. No acute osseous pathology. IMPRESSION: Left perihilar mass.  No new consolidative changes. Electronically Signed   By: Anner Crete M.D.   On: 02/05/2018 01:06     Scheduled Meds: . aspirin EC  81 mg Oral Daily  . enoxaparin (LOVENOX) injection  40 mg Subcutaneous Q24H  . feeding supplement (ENSURE ENLIVE)  237 mL Oral BID BM  . gemfibrozil  600 mg Oral QAC breakfast  . levothyroxine  150 mcg Oral QAC breakfast  . sucralfate  1 g Oral TID WC & HS  . tamsulosin  0.4 mg Oral QHS   Continuous Infusions: . piperacillin-tazobactam (ZOSYN)  IV Stopped (02/06/18 1513)  . vancomycin 1,250 mg (02/06/18 0600)     LOS: 1 day   Time Spent in minutes  30 minutes  Purvis Sidle D.O. on 02/06/2018 at 3:18 PM  Between 7am to 7pm - Pager - [phone]  After 7pm go to [email or web address] - password [email or web address]  And look for the night coverage person covering for me after hours  [facility] Office  [phone]

## 2018-02-06 NOTE — Evaluation (Signed)
Physical Therapy Evaluation Patient Details Name: Frank Wood MRN: [id number] DOB: 1932-08-17 Today's Date: 02/06/2018   History of Present Illness  84 yo male adm to [facility] with fever - new diagnosis of NSCLC Stage 3 undergoing chemoradiation.   Pt had chemo on day of admit and now reports dysphagia x several weeks.  Admitted due to fevers.   Clinical Impression  Patient presents close to functional baseline, but with some mild instability with gait.  At risk for falls with community mobility per DGI with score 18/24 (scores <19 demonstrate fall risk in older community dwelling adults).  He will have assist from family and likely will continue to improve with continued symptom improvement.  Did discuss possibility of neuropathy depending on chemo and to watch for it and use walker as needed if balance seems worse.  Also discussed talking with MD about outpatient PT if needed once recovered from this acute episode and following last two rounds of chemo.  No follow up PT needs noted at this time.  Will sign off as pt with needed level of assist and close to baseline.     Follow Up Recommendations No PT follow up    Equipment Recommendations  None recommended by PT    Recommendations for Other Services       Precautions / Restrictions Precautions Precautions: Fall      Mobility  Bed Mobility Overal bed mobility: Modified Independent                Transfers Overall transfer level: Needs assistance   Transfers: Sit to/from Stand Sit to Stand: Supervision         General transfer comment: for safety mildly unsteady, stiff intially upon standing  Ambulation/Gait Ambulation/Gait assistance: Supervision;Min guard Gait Distance (Feet): 240 Feet Assistive device: None Gait Pattern/deviations: Step-through pattern;Decreased stride length;Trunk flexed     General Gait Details: decreased trunk rotation  Stairs Stairs: Yes Stairs assistance: Supervision Stair  Management: One rail Right;Alternating pattern;Forwards Number of Stairs: 2    Wheelchair Mobility    Modified Rankin (Stroke Patients Only)       Balance                                 Standardized Balance Assessment Standardized Balance Assessment : Dynamic Gait Index   Dynamic Gait Index Level Surface: Mild Impairment Change in Gait Speed: Mild Impairment Gait with Horizontal Head Turns: Mild Impairment Gait with Vertical Head Turns: Normal Gait and Pivot Turn: Moderate Impairment Step Over Obstacle: Normal Step Around Obstacles: Normal Steps: Mild Impairment Total Score: 18       Pertinent Vitals/Pain Pain Assessment: No/denies pain Faces Pain Scale: Hurts a little bit Pain Location: does report lower back bilat hip pain if sitting too long    Home Living Family/patient expects to be discharged to:: Private residence Living Arrangements: Spouse/significant other;Children Available Help at Discharge: Family Type of Home: House Home Access: Stairs to enter   Technical brewer of Steps: 2 Home Layout: Able to live on main level with bedroom/bathroom Home Equipment: Environmental consultant - 2 wheels      Prior Function Level of Independence: Independent               Hand Dominance   Dominant Hand: Right    Extremity/Trunk Assessment   Upper Extremity Assessment Upper Extremity Assessment: Overall WFL for tasks assessed    Lower Extremity Assessment Lower Extremity Assessment: Overall  WFL for tasks assessed    Cervical / Trunk Assessment Cervical / Trunk Assessment: Other exceptions Cervical / Trunk Exceptions: stiffness throughout spine with ambulation  Communication   Communication: No difficulties;HOH  Cognition Arousal/Alertness: Awake/alert Behavior During Therapy: WFL for tasks assessed/performed Overall Cognitive Status: History of cognitive impairments - at baseline                                 General  Comments: slow processing      General Comments      Exercises     Assessment/Plan    PT Assessment Patent does not need any further PT services  PT Problem List         PT Treatment Interventions      PT Goals (Current goals can be found in the Care Plan section)  Acute Rehab PT Goals PT Goal Formulation: All assessment and education complete, DC therapy    Frequency     Barriers to discharge        Co-evaluation               AM-PAC PT "6 Clicks" Daily Activity  Outcome Measure Difficulty turning over in bed (including adjusting bedclothes, sheets and blankets)?: None Difficulty moving from lying on back to sitting on the side of the bed? : A Little Difficulty sitting down on and standing up from a chair with arms (e.g., wheelchair, bedside commode, etc,.)?: A Little Help needed moving to and from a bed to chair (including a wheelchair)?: A Little   Help needed climbing 3-5 steps with a railing? : A Little 6 Click Score: 16    End of Session Equipment Utilized During Treatment: Gait belt Activity Tolerance: Patient tolerated treatment well Patient left: in bed;with call bell/phone within reach;with family/visitor present   PT Visit Diagnosis: Other abnormalities of gait and mobility (R26.89)    Time: 3578-9784 PT Time Calculation (min) (ACUTE ONLY): 23 min   Charges:   PT Evaluation $PT Eval Low Complexity: 1 Low PT Treatments $Gait Training: 8-22 mins   PT G CodesMagda Kiel, [place] [phone] 02/06/2018   Reginia Naas 02/06/2018, 1:03 PM

## 2018-02-06 NOTE — Evaluation (Signed)
Clinical/Bedside Swallow Evaluation Patient Details  Name: Frank Wood [facility] MRN: [id number] Date of Birth: 1933/08/06  Today's Date: 02/06/2018 Time: SLP Start Time (ACUTE ONLY): 24 SLP Stop Time (ACUTE ONLY): 1100 SLP Time Calculation (min) (ACUTE ONLY): 10 min  Past Medical History:  Past Medical History:  Diagnosis Date  . Arthritis   . Diverticulosis   . Glaucoma   . Hypertension   . Hypothyroidism    Past Surgical History:  Past Surgical History:  Procedure Laterality Date  . BACK SURGERY     HPI:  84 yo male adm to [facility] with fever - new diagnosis of NSCLC Stage 3 undergoing chemoradiation.   Pt had chemo on day of admit and now reports dysphagia x several weeks. Dysphagia started a few weeks after radiation started.  Pt has been receiving carafate and compazine.  He has undergone endoscopy prior for anorexia/weight loss in 06/2016 - was negative.  Pt also admits to taste changes with radiation.  MRI 01/03/2017 negative for acute findings.  Pt points to esophagus to indicate areas of difficulty and states he has issues with burning/odynophagia.  Temp 102.5 and WBC 4.0.     Assessment / Plan / Recommendation Clinical Impression  Pt presents with functional oropharyngeal swallow ability - no indications of mucositis or pharyngeal dysphagia.  Pt does have very slight minimal left lower facial asymmetry - which is baseline.  Observed pt consuming yogurt and water with no indication of airway compromise.  Pt admits to issues with odynophagia and mild hoarseness.  Advised they monitor his swallowing due to risk for possible radiation induced strictures - provided him with compensation strategies for esophagitis in writing and verbally.  Xerostomia tips also provided.    No SLP follow up needed as education completed to compensate for possible esophagitis.  MD arrived and was informed to findings and advised magic mouthwash would be initiated.  If pt's dysphagia does not improve  post-radiation referral to GI recommended.  SLP Visit Diagnosis: Dysphagia, unspecified (R13.10)    Aspiration Risk  No limitations    Diet Recommendation Regular;Thin liquid(avoid acidic, hot, spicy items, choose bland foods, room temp may be tolerated better)   Liquid Administration via: Straw Medication Administration: Whole meds with liquid Supervision: Patient able to self feed Compensations: Minimize environmental distractions;Slow rate;Small sips/bites(drink liquids t/o meal) Postural Changes: Remain upright for at least 30 minutes after po intake;Seated upright at 90 degrees    Other  Recommendations Oral Care Recommendations: Oral care BID   Follow up Recommendations None      Frequency and Duration     n/a       Prognosis     n/a   Swallow Study   General Date of Onset: 02/06/18 HPI: 84 yo male adm to [facility] with fever - new diagnosis of NSCLC Stage 3 undergoing chemoradiation.   Pt had chemo on day of admit and now reports dysphagia x several weeks. Dysphagia started a few weeks after radiation started.  Pt has been receiving carafate and compazine.  He has undergone endoscopy prior for anorexia/weight loss in 06/2016 - was negative.  Pt also admits to taste changes with radiation.  MRI 01/03/2017 negative for acute findings.  Pt points to esophagus to indicate areas of difficulty and states he has issues with burning/odynophagia.  Temp 102.5 and WBC 4.0.   Type of Study: Bedside Swallow Evaluation Diet Prior to this Study: Regular;Thin liquids Temperature Spikes Noted: No Respiratory Status: Room air History of Recent Intubation: No  Behavior/Cognition: Alert;Cooperative;Pleasant mood Oral Cavity Assessment: Within Functional Limits Oral Cavity - Dentition: Dentures, top;Adequate natural dentition;Other (Comment)(lower partial) Vision: Functional for self-feeding Self-Feeding Abilities: Able to feed self Patient Positioning: Upright in bed Baseline Vocal Quality:  Hoarse(slightly hoarse) Volitional Cough: Strong Volitional Swallow: Able to elicit    Oral/Motor/Sensory Function Overall Oral Motor/Sensory Function: Within functional limits   Ice Chips Ice chips: Not tested   Thin Liquid Thin Liquid: Within functional limits Presentation: Straw    Nectar Thick Nectar Thick Liquid: Not tested   Honey Thick Honey Thick Liquid: Not tested   Puree Puree: Within functional limits Presentation: Self Fed;Spoon   Solid   GO   Solid: Not tested        Frank Wood 02/06/2018,11:19 AM Luanna Salk, [facility] SLP [phone]

## 2018-02-07 ENCOUNTER — Encounter (HOSPITAL_COMMUNITY): Payer: Self-pay | Admitting: Emergency Medicine

## 2018-02-07 ENCOUNTER — Ambulatory Visit
Admission: RE | Admit: 2018-02-07 | Discharge: 2018-02-07 | Disposition: A | Discharge status: Home / Self Care | Payer: Medicare Other | Source: Ambulatory Visit | Attending: Radiation Oncology | Admitting: Radiation Oncology

## 2018-02-07 DIAGNOSIS — R6510 Systemic inflammatory response syndrome (SIRS) of non-infectious origin without acute organ dysfunction: Secondary | ICD-10-CM

## 2018-02-07 LAB — BASIC METABOLIC PANEL
Anion gap: 6 (ref 5–15)
BUN: 20 mg/dL (ref 8–23)
CO2: 25 mmol/L (ref 22–32)
CREATININE: 0.89 mg/dL (ref 0.61–1.24)
Calcium: 8.9 mg/dL (ref 8.9–10.3)
Chloride: 105 mmol/L (ref 98–111)
GFR calc Af Amer: >60 mL/min (ref >60)
Glucose, Bld: 128 mg/dL — ABNORMAL HIGH (ref 70–99)
POTASSIUM: 4.4 mmol/L (ref 3.5–5.1)
SODIUM: 136 mmol/L (ref 135–145)

## 2018-02-07 LAB — CBC
HCT: 29 % — ABNORMAL LOW (ref 39.0–52.0)
Hemoglobin: 9.5 g/dL — ABNORMAL LOW (ref 13.0–17.0)
MCH: 28.4 pg (ref 26.0–34.0)
MCHC: 32.8 g/dL (ref 30.0–36.0)
MCV: 86.8 fL (ref 78.0–100.0)
PLATELETS: 138 10*3/uL — AB (ref 150–400)
RBC: 3.34 MIL/uL — AB (ref 4.22–5.81)
RDW: 15 % (ref 11.5–15.5)
WBC: 1.8 10*3/uL — ABNORMAL LOW (ref 4.0–10.5)

## 2018-02-07 LAB — DIFFERENTIAL
BASOS PCT: 1 %
Basophils Absolute: 0 10*3/uL (ref 0.0–0.1)
EOS PCT: 1 %
Eosinophils Absolute: 0 10*3/uL (ref 0.0–0.7)
Lymphocytes Relative: 18 %
Lymphs Abs: 0.3 10*3/uL — ABNORMAL LOW (ref 0.7–4.0)
Monocytes Absolute: 0.1 10*3/uL (ref 0.1–1.0)
Monocytes Relative: 5 %
NEUTROS PCT: 75 %
Neutro Abs: 1.3 10*3/uL — ABNORMAL LOW (ref 1.7–7.7)

## 2018-02-07 MED ORDER — MAGIC MOUTHWASH W/LIDOCAINE
10.0000 mL | Freq: Three times a day (TID) | ORAL | Status: DC
  Administered 2018-02-07: 10 mL via ORAL
  Filled 2018-02-07 (×2): qty 10

## 2018-02-07 MED ORDER — DOXYCYCLINE HYCLATE 100 MG PO CAPS: 100.0000 mg | ORAL_CAPSULE | Freq: Two times a day (BID) | ORAL | 0 refills | Status: DC

## 2018-02-07 MED ORDER — SUCRALFATE 1 GM/10ML PO SUSP: 1.0000 g | Freq: Three times a day (TID) | ORAL | 0 refills | Status: DC

## 2018-02-07 MED ORDER — MAGIC MOUTHWASH W/LIDOCAINE
10.0000 mL | Freq: Three times a day (TID) | ORAL | 0 refills | Status: DC
Start: 2018-02-07 — End: 2018-02-15

## 2018-02-07 MED ORDER — SIMETHICONE 40 MG/0.6ML PO SUSP
40.0000 mg | Freq: Four times a day (QID) | ORAL | Status: DC | PRN
  Filled 2018-02-07: qty 0.6

## 2018-02-07 MED ORDER — SIMETHICONE 40 MG/0.6ML PO SUSP: 40.0000 mg | Freq: Four times a day (QID) | ORAL | 0 refills | Status: DC | PRN

## 2018-02-07 NOTE — Discharge Summary (Signed)
Physician Discharge Summary  Frank Wood [facility] XTG:[id number] DOB: [birth year] DOA: 02/04/2018  PCP: System, Pcp Not In  Admit date: 02/04/2018 Discharge date: 02/07/2018  Time spent: 45 minutes  Recommendations for Outpatient Follow-up:  Patient will be discharged to home.  Patient will need to follow up with primary care provider within one week of discharge.  Follow up with oncology, Dr. Julien Nordmann. Patient should continue medications as prescribed.  Patient should follow a regular diet.   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 Lattie Corns Moyeis a 84 y.o.malewith medical history significant ofNSCLC stage 3 undergoing chemo and radiation therapy. Just had chemo earlier in afternoon PTA. Onset of fever and chills at home at ~ 8pm. Tm 103 at home. Took tylenol. Presented to ED.  No nausea. Did have diarrhea yesterday. No abd pain. No dysuria, no productive cough. Has had macular skin rash for at least a week which he attributes to chemo.  Hospital Course:  SIRS -Unclear source, sepsis ruled out as no infection was found -Presented with fever and leukopenia -Patient did have one day of diarrhea prior to admission, however has not further bowel movement -Blood cultures show no growth -UA unremarkable for infection -CXR unremarkable for infection -Placed on empiric Zosyn and vancomycin -will discharge patient with doxycyline  -Leukopenia, WBC 1.8, neutrophil count 1.3- discussed with Dr. Julien Nordmann, did not recommend GCSF at this time and will follow up with as an oupatient  Non-small cell lung cancer -Received chemotherapy and radiation prior  to admission -Dr. 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  Continues to have problems swallowing.  Denies current chest pain, shortness of breath, abdominal pain, nausea or vomiting, diarrhea or constipation, headache or dizziness. Complains of gas.   General: Well developed, well nourished, NAD, appears stated age  60: NCAT,  mucous membranes moist.  Neck: Supple  Cardiovascular: S1 S2 auscultated, no murmurs, RRR  Respiratory: Clear to auscultation bilaterally with equal chest rise  Abdomen: Soft, nontender, nondistended, + bowel sounds  Extremities: warm dry without cyanosis clubbing or edema  Neuro: AAOx3, nonfocal  Psych: Normal affect and demeanor, pleasant   Discharge Instructions Discharge Instructions    Discharge instructions   Complete by:  As directed    Patient will be discharged to home.  Patient will need to follow up with primary care provider within one week of discharge.  Follow up with oncology, Dr. Julien Nordmann. Patient should continue medications as prescribed.  Patient should follow a regular diet.     Allergies as of 02/07/2018   No Known Allergies     Medication List    STOP taking these medications   sucralfate 1 g tablet Commonly known as:  CARAFATE Replaced by:  sucralfate 1 GM/10ML suspension  TAKE these medications   aspirin EC 81 MG tablet Take 81 mg by mouth daily.   benazepril 20 MG tablet Commonly known as:  LOTENSIN Take 20 mg by mouth daily.   doxycycline 100 MG  capsule Commonly known as:  VIBRAMYCIN Take 1 capsule (100 mg total) by mouth 2 (two) times daily.   gabapentin 100 MG capsule Commonly known as:  NEURONTIN Take 100 mg by mouth daily as needed (pain).   gemfibrozil 600 MG tablet Commonly known as:  LOPID Take 600 mg by mouth daily.   HYDROcodone-acetaminophen 7.5-325 mg/15 ml solution Commonly known as:  HYCET Take 10-15 mLs by mouth every 4 (four) hours as needed for moderate pain.   levothyroxine 150 MCG tablet Commonly known as:  SYNTHROID, LEVOTHROID Take 150 mcg by mouth daily before breakfast.   magic mouthwash w/lidocaine Soln Take 10 mLs by mouth 4 (four) times daily -  before meals and at bedtime.   meloxicam 15 MG tablet Commonly known as:  MOBIC Take 15 mg by mouth daily.   prochlorperazine 10 MG tablet Commonly known as:  COMPAZINE Take 1 tablet (10 mg total) by mouth every 6 (six) hours as needed for nausea or vomiting.   simethicone 40 MG/0.6ML drops Commonly known as:  MYLICON Take 0.6 mLs (40 mg total) by mouth every 6 (six) hours as needed for flatulence.   sucralfate 1 GM/10ML suspension Commonly known as:  CARAFATE Take 10 mLs (1 g total) by mouth 4 (four) times daily -  with meals and at bedtime. Replaces:  sucralfate 1 g tablet   tamsulosin 0.4 MG Caps capsule Commonly known as:  FLOMAX Take 0.4 mg by mouth at bedtime.   zolpidem 10 MG tablet Commonly known as:  AMBIEN Take 10 mg by mouth at bedtime as needed for sleep.      No Known Allergies    The results of significant diagnostics from this hospitalization (including imaging, microbiology, ancillary and laboratory) are listed below for reference.    Significant Diagnostic Studies: Dg Chest 2 View  Result Date: 02/05/2018 CLINICAL DATA:  84 year old male with history of lung cancer. Patient had chemo treatment today. Fever. EXAM: CHEST - 2 VIEW COMPARISON:  PET-CT dated 01/03/2018 FINDINGS: There is a 5.2 x 3.4 cm left perihilar  density corresponding to the known mass/neoplasm. No new consolidative changes. There is no pleural effusion or pneumothorax. The cardiac silhouette is within normal limits. No acute osseous pathology. IMPRESSION: Left perihilar mass.  No new consolidative changes. Electronically Signed   By: Anner Crete M.D.   On: 02/05/2018 01:06    Microbiology: Recent Results (from the past 240 hour(s))  Culture, blood (Routine x 2)     Status: None (Preliminary result)   Collection Time: 02/05/18 12:05 AM  Result Value Ref Range Status   Specimen Description   Final    BLOOD RIGHT ANTECUBITAL Performed at [facility] [address], [place], [place] [address]    Special Requests   Final    BOTTLES DRAWN AEROBIC AND ANAEROBIC Blood Culture adequate volume Performed at [facility] [address], [place], [place] [address]    Culture   Final    NO GROWTH 2 DAYS Performed at [facility] [address] [place], [place] [address]    Report Status PENDING  Incomplete  Culture, blood (Routine x 2)     Status: None (Preliminary result)   Collection Time: 02/05/18 12:08 AM  Result Value  Ref Range Status   Specimen Description   Final    BLOOD RIGHT HAND Performed at [facility] [address], [place], [place] [address]    Special Requests   Final    BOTTLES DRAWN AEROBIC AND ANAEROBIC Blood Culture adequate volume Performed at [facility] [address] [place], [place] [address]    Culture   Final    NO GROWTH 2 DAYS Performed at [facility] [address], [place], [place] [address]    Report Status PENDING  Incomplete     Labs: Basic Metabolic Panel: Recent Labs  Lab 02/04/18 1031 02/04/18 2310 02/05/18 0813 02/06/18 0414 02/07/18 0416  NA 136 134* 136 134* 136  K 4.5 4.3 4.4 4.4 4.4  CL 103 105 108 105 105  CO2 24 21* 21* 23 25  GLUCOSE 123 197* 173* 117* 128*  BUN 18 27*  24* 21 20  CREATININE 0.95 1.13 1.12 1.00 0.89  CALCIUM 9.1 8.5* 8.0* 8.2* 8.9  MG  --   --  1.7 1.7  --    Liver Function Tests: Recent Labs  Lab 02/04/18 1031 02/04/18 2310 02/05/18 0813 02/06/18 0414  AST 18 28 25 25   ALT 18 24 24 23   ALKPHOS 65 58 49 42  BILITOT 0.5 0.5 0.7 0.6  PROT 6.8 6.6 5.9* 5.8*  ALBUMIN 3.4* 3.2* 3.0* 2.7*   No results for input(s): LIPASE, AMYLASE in the last 168 hours. No results for input(s): AMMONIA in the last 168 hours. CBC: Recent Labs  Lab 02/04/18 1031 02/04/18 2310 02/05/18 0813 02/06/18 0414 02/07/18 0416  WBC 3.5* 3.6* 4.0 2.5* 1.8*  NEUTROABS 2.5 3.5 3.8 2.0 1.3*  HGB 10.5* 9.7* 9.7* 9.1* 9.5*  HCT 31.9* 30.7* 29.8* 28.0* 29.0*  MCV 86.3 88.2 87.6 87.5 86.8  PLT 188 186 155 150 138*   Cardiac Enzymes: No results for input(s): CKTOTAL, CKMB, CKMBINDEX, TROPONINI in the last 168 hours. BNP: BNP (last 3 results) No results for input(s): BNP in the last 8760 hours.  ProBNP (last 3 results) No results for input(s): PROBNP in the last 8760 hours.  CBG: No results for input(s): GLUCAP in the last 168 hours.     Signed:  Cristal Ford  [facility] 02/07/2018, 12:01 PM

## 2018-02-07 NOTE — Progress Notes (Signed)
Pt discharged from the unit via wheelchair. Discharge instructions were reviewed with the pt and family member. No questions or concerns at this time.

## 2018-02-08 ENCOUNTER — Ambulatory Visit
Admission: RE | Admit: 2018-02-08 | Discharge: 2018-02-08 | Disposition: A | Discharge status: Home / Self Care | Payer: Medicare Other | Source: Ambulatory Visit | Attending: Radiation Oncology | Admitting: Radiation Oncology

## 2018-02-08 ENCOUNTER — Telehealth: Payer: Self-pay | Admitting: *Deleted

## 2018-02-08 DIAGNOSIS — C3412 Malignant neoplasm of upper lobe, left bronchus or lung: Secondary | ICD-10-CM | POA: Diagnosis not present

## 2018-02-08 DIAGNOSIS — Z510 Encounter for antineoplastic radiation therapy: Secondary | ICD-10-CM | POA: Diagnosis present

## 2018-02-08 MED FILL — MAGIC MW (LID/NYS/MAA/BEN): 7 days supply | Qty: 300 | Fill #0

## 2018-02-08 NOTE — Telephone Encounter (Signed)
Oncology Nurse Navigator Documentation  Oncology Nurse Navigator Flowsheets 02/08/2018  Navigator Location [facility]  Navigator Encounter Type Telephone/Ms. Pucciarelli called. She asked if Dr. Julien Nordmann was aware patient was in the hospital recently with temp of 103.  I listened as she explained.  She then stated Dr. Julien Nordmann came to see her and her husband in the hospital.  I was unclear of what I could do to help Ms. Knapik.  I updated her that Dr. Julien Nordmann will see Mr. Pelzer on Monday and go over lab work and how he was doing.    Telephone Incoming Call  Treatment Phase Treatment  Barriers/Navigation Needs Education  Education Other  Interventions Education  Education Method Verbal  Acuity Level 1  Time Spent with Patient 15

## 2018-02-08 NOTE — Assessment & Plan Note (Addendum)
This is a very pleasant 84 year old white male recently diagnosed with stage IIIa (T2 a, N2, M0) non-small cell lung cancer, squamous cell carcinoma in a never smoker patient. The patient is currently on concurrent chemoradiation with chemotherapy consisting of carboplatin for an AUC of 2 and paclitaxel 80 mg meter squared.  Status post 5 cycles.  He is experiencing fatigue and has been hospitalized for febrile neutropenia.  He has recovered from his recent hospitalization.  He is afebrile.  ANC is 1.3 today. The patient had foundation 1 studies recently and is here to discuss the results.  The patient was seen with Dr. Julien Nordmann.  Foundation 1 results were discussed with the patient and his wife which showed an EGFR mutation.  We discussed that if he has disease recurrence in the future, we can consider giving him oral targeted therapy for treatment. Labs reviewed and ANC is 1.3.  We discussed that he may proceed with treatment this week but if he is not feeling up to it, he may omit the cycle of treatment and resume next week. The patient would like to proceed as scheduled today.  Okay to proceed with ANC of 1.3. He will follow-up next week for labs and chemotherapy and have a follow-up visit in 2 weeks for evaluation and repeat lab work. He was advised to call us immediately if he develops any fevers or any other concerning symptoms and we can see him in our symptom management clinic.  For his odynophagia, he was advised to continue Carafate 4 times a day and use hydrocodone as needed for pain.  The patient was advised to call immediately if he has any concerning symptoms in the interval. The patient voices understanding of current disease status and treatment options and is in agreement with the current care plan.

## 2018-02-08 NOTE — Progress Notes (Signed)
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 EGFR L858R, amplification CDKN2A/B loss MTAP loss exons 2-8 JK93 splice site [id number]>W 7 Disease relevant genes with no reportable alterations: KRAS, RET, ALK, MET, ERBB2, BRAF, ROS1   PRIOR THERAPY: None  CURRENT THERAPY: Chemoradiation with Taxol 45 mg/m2 and carboplatin AUC 2 started on 01/09/2018.  Status post 5 cycles.  INTERVAL HISTORY: Frank Wood 84 y.o. male returns for routine follow-up visit.  The patient was recently discharged from the hospital on 02/07/2018 for possible sepsis versus SIRS.  The patient presented with fever and leukopenia.  Work-up during the hospitalization showed negative blood cultures, negative urinalysis, and a negative chest x-ray.  He was empirically treated with Zosyn and vancomycin and discharged on doxycycline.  He has completed his doxycycline.  The patient reports that he still has fatigue.  He has not had any recurrent fevers or chills since hospital discharge.  He reports a decreased appetite and he has lost weight since his last visit.  He reports odynophagia secondary to radiation.  He remains on Carafate 4 times a day and has hydrocodone as needed for pain.  He has not taken this in several days.  Denies shortness of breath, cough, hemoptysis.  Denies nausea, vomiting, constipation, diarrhea.  The patient is here for evaluation prior to cycle #6 of his chemotherapy.  MEDICAL HISTORY: Past Medical History:  Diagnosis Date  . Arthritis   . Diverticulosis   . Glaucoma   . Hypertension   . Hypothyroidism     ALLERGIES:  has No Known Allergies.  MEDICATIONS:   Current Outpatient Medications  Medication Sig Dispense Refill  . aspirin EC 81 MG tablet Take 81 mg by mouth daily.    . benazepril (LOTENSIN) 20 MG tablet Take 20 mg by mouth daily.   1  . gabapentin (NEURONTIN) 100 MG capsule Take 100 mg by mouth daily as needed (pain).     Marland Kitchen gemfibrozil (LOPID) 600 MG tablet Take 600 mg by mouth daily.    Marland Kitchen HYDROcodone-acetaminophen (HYCET) 7.5-325 mg/15 ml solution Take 10-15 mLs by mouth every 4 (four) hours as needed for moderate pain. 473 mL 0  . levothyroxine (SYNTHROID, LEVOTHROID) 150 MCG tablet Take 150 mcg by mouth daily before breakfast.   0  . magic mouthwash w/lidocaine SOLN Take 10 mLs by mouth 4 (four) times daily -  before meals and at bedtime. 300 mL 0  . meloxicam (MOBIC) 15 MG tablet Take 15 mg by mouth daily.    . prochlorperazine (COMPAZINE) 10 MG tablet Take 1 tablet (10 mg total) by mouth every 6 (six) hours as needed for nausea or vomiting. 30 tablet 0  . simethicone (MYLICON) 40 PY/0.9XI drops Take 0.6 mLs (40 mg total) by mouth every 6 (six) hours as needed for flatulence. 30 mL 0  . sucralfate (CARAFATE) 1 GM/10ML suspension Take 10 mLs (1 g total) by mouth 4 (four) times daily -  with meals and at bedtime. 420 mL 0  . tamsulosin (FLOMAX) 0.4 MG CAPS capsule Take 0.4 mg by mouth at bedtime.     Marland Kitchen zolpidem (AMBIEN) 10 MG tablet Take 10 mg by mouth at bedtime as needed for  sleep.     No current facility-administered medications for this visit.     SURGICAL HISTORY:  Past Surgical History:  Procedure Laterality Date  . BACK SURGERY      REVIEW OF SYSTEMS:   Review of Systems  Review of Systems  Constitutional: Positive for malaise/fatigue and weight loss. Negative for chills and fever.  HENT: Negative.   Eyes: Negative.   Respiratory: Negative.   Cardiovascular: Negative.  Negative for chest pain and leg swelling.  Gastrointestinal: Negative.        The patient reports odynophagia secondary to radiation.  He is on  Carafate 4 times a day and has hydrocodone to use as needed.  Genitourinary: Negative.   Musculoskeletal: Negative.   Skin: Negative.   Neurological: Negative.   Endo/Heme/Allergies: Negative.   Psychiatric/Behavioral: Negative.       PHYSICAL EXAMINATION:  Blood pressure (!) 163/69, pulse (!) 121, temperature 97.6 F (36.4 C), temperature source Oral, resp. rate 18, height 5' 10"  (1.778 m), weight 181 lb 12.8 oz (82.5 kg), SpO2 97 %.  ECOG PERFORMANCE STATUS: 1 - Symptomatic but completely ambulatory  Physical Exam  Physical Exam  Constitutional: He is oriented to person, place, and time. He appears well-developed and well-nourished. No distress.  HENT:  Head: Normocephalic and atraumatic.  Mouth/Throat: Oropharynx is clear and moist. No oropharyngeal exudate.  Eyes: Conjunctivae and EOM are normal. No scleral icterus.  Neck: Normal range of motion. Neck supple.  Cardiovascular: Normal rate, regular rhythm, normal heart sounds and intact distal pulses.  Pulmonary/Chest: Effort normal and breath sounds normal. No respiratory distress. He has no wheezes. He has no rales.  Abdominal: Soft. Bowel sounds are normal. He exhibits no distension. No hernia.  Musculoskeletal: Normal range of motion.  Lymphadenopathy:    He has no cervical adenopathy.  Neurological: He is alert and oriented to person, place, and time. He exhibits normal muscle tone. Coordination normal.  Skin: Skin is warm and dry. No rash noted. He is not diaphoretic.    LABORATORY DATA: Lab Results  Component Value Date   WBC 2.2 (L) 02/11/2018   HGB 10.2 (L) 02/11/2018   HCT 31.5 (L) 02/11/2018   MCV 86.8 02/11/2018   PLT 181 02/11/2018      Chemistry      Component Value Date/Time   NA 136 02/11/2018 0845   K 4.2 02/11/2018 0845   CL 104 02/11/2018 0845   CO2 23 02/11/2018 0845   BUN 22 02/11/2018 0845   CREATININE 0.95 02/11/2018 0845      Component Value Date/Time   CALCIUM 9.3 02/11/2018 0845    ALKPHOS 60 02/11/2018 0845   AST 25 02/11/2018 0845   ALT 50 (H) 02/11/2018 0845   BILITOT 0.4 02/11/2018 0845       RADIOGRAPHIC STUDIES:  Dg Chest 2 View  Result Date: 02/05/2018 CLINICAL DATA:  84 year old male with history of lung cancer. Patient had chemo treatment today. Fever. EXAM: CHEST - 2 VIEW COMPARISON:  PET-CT dated 01/03/2018 FINDINGS: There is a 5.2 x 3.4 cm left perihilar density corresponding to the known mass/neoplasm. No new consolidative changes. There is no pleural effusion or pneumothorax. The cardiac silhouette is within normal limits. No acute osseous pathology. IMPRESSION: Left perihilar mass.  No new consolidative changes. Electronically Signed   By: Anner Crete M.D.   On: 02/05/2018 01:06     ASSESSMENT/PLAN:  Stage III squamous cell carcinoma of left lung [facility]) This is a very pleasant 84 year old  white male recently diagnosed with stage IIIa (T2 a, N2, M0) non-small cell lung cancer, squamous cell carcinoma in a never smoker patient. The patient is currently on concurrent chemoradiation with chemotherapy consisting of carboplatin for an AUC of 2 and paclitaxel 80 mg meter squared.  Status post 5 cycles.  He is experiencing fatigue and has been hospitalized for febrile neutropenia.  He has recovered from his recent hospitalization.  He is afebrile.  ANC is 1.3 today. The patient had foundation 1 studies recently and is here to discuss the results.  The patient was seen with Dr. Julien Nordmann.  Foundation 1 results were discussed with the patient and his wife which showed an EGFR mutation.  We discussed that if he has disease recurrence in the future, we can consider giving him oral targeted therapy for treatment. Labs reviewed and ANC is 1.3.  We discussed that he may proceed with treatment this week but if he is not feeling up to it, he may omit the cycle of treatment and resume next week. The patient would like to proceed as scheduled today.  Okay to proceed with  ANC of 1.3. He will follow-up next week for labs and chemotherapy and have a follow-up visit in 2 weeks for evaluation and repeat lab work. He was advised to call us immediately if he develops any fevers or any other concerning symptoms and we can see him in our symptom management clinic.  For his odynophagia, he was advised to continue Carafate 4 times a day and use hydrocodone as needed for pain.  The patient was advised to call immediately if he has any concerning symptoms in the interval. The patient voices understanding of current disease status and treatment options and is in agreement with the current care plan.    No orders of the defined types were placed in this encounter.  Mikey Bussing, DNP, AGPCNP-BC, AOCNP 02/11/18   ADDENDUM: Hematology/Oncology Attending: I had a face-to-face encounter with the patient today.  I recommended his care plan.  This is a very pleasant 84 years old white male recently diagnosed with a stage IIIa non-small cell lung cancer, squamous cell carcinoma in addition with interval smoking history.  He is currently undergoing a course of concurrent chemoradiation with weekly carboplatin and paclitaxel status post 5 cycles of the treatment.  The patient has been tolerating this treatment well but he was recently admitted to [facility] with fever of unexplained etiology.  This could be likely secondary to mucous membrane invasion of bacteria secondary to radiotherapy.  His absolute neutrophil count was fine during his hospitalization.  His total white blood count today is low with absolute neutrophil count of 1300.  I had a lengthy discussion with the patient today about his condition.  I gave him the option of delaying his treatment by 1 week but he also could proceed with his treatment today as a scheduled.  He would like to proceed with the treatment today. I also order molecular studies on this patient and surprisingly with his squamous cell carcinoma  his molecular studies by foundation 1 showed positive EGFR mutation in exon 21 (L858R).  He would probably benefit from targeted therapy in the future if he has any disease progression. I will see the patient back for follow-up visit in 2 weeks for reevaluation and management of any adverse effect of his treatment.  He was also advised to call immediately if he has any concerning symptoms or any low-grade fever before he becomes really  sick and requiring hospitalization.  Disclaimer: This note was dictated with voice recognition software. Similar sounding words can inadvertently be transcribed and may be missed upon review. Eilleen Kempf, MD 02/11/18

## 2018-02-10 LAB — CULTURE, BLOOD (ROUTINE X 2)
Culture: NO GROWTH
Culture: NO GROWTH
Special Requests: ADEQUATE
Special Requests: ADEQUATE

## 2018-02-11 ENCOUNTER — Inpatient Hospital Stay (HOSPITAL_BASED_OUTPATIENT_CLINIC_OR_DEPARTMENT_OTHER): Payer: Medicare Other | Admitting: Oncology

## 2018-02-11 ENCOUNTER — Encounter: Payer: Self-pay | Admitting: Oncology

## 2018-02-11 ENCOUNTER — Encounter: Payer: Self-pay | Admitting: *Deleted

## 2018-02-11 ENCOUNTER — Telehealth: Payer: Self-pay | Admitting: Oncology

## 2018-02-11 ENCOUNTER — Inpatient Hospital Stay: Payer: Medicare Other | Attending: Internal Medicine

## 2018-02-11 ENCOUNTER — Inpatient Hospital Stay: Payer: Medicare Other

## 2018-02-11 ENCOUNTER — Ambulatory Visit
Admission: RE | Admit: 2018-02-11 | Discharge: 2018-02-11 | Disposition: A | Discharge status: Home / Self Care | Payer: Medicare Other | Source: Ambulatory Visit | Attending: Radiation Oncology | Admitting: Radiation Oncology

## 2018-02-11 VITALS — HR 98

## 2018-02-11 VITALS — BP 163/69 | HR 121 | Temp 97.6°F | Resp 18 | Ht 70.0 in | Wt 181.8 lb

## 2018-02-11 DIAGNOSIS — Z8042 Family history of malignant neoplasm of prostate: Secondary | ICD-10-CM

## 2018-02-11 DIAGNOSIS — C3412 Malignant neoplasm of upper lobe, left bronchus or lung: Secondary | ICD-10-CM | POA: Diagnosis present

## 2018-02-11 DIAGNOSIS — Z800 Family history of malignant neoplasm of digestive organs: Secondary | ICD-10-CM | POA: Diagnosis not present

## 2018-02-11 DIAGNOSIS — Y842 Radiological procedure and radiotherapy as the cause of abnormal reaction of the patient, or of later complication, without mention of misadventure at the time of the procedure: Secondary | ICD-10-CM | POA: Insufficient documentation

## 2018-02-11 DIAGNOSIS — C342 Malignant neoplasm of middle lobe, bronchus or lung: Secondary | ICD-10-CM | POA: Insufficient documentation

## 2018-02-11 DIAGNOSIS — D701 Agranulocytosis secondary to cancer chemotherapy: Secondary | ICD-10-CM | POA: Diagnosis not present

## 2018-02-11 DIAGNOSIS — Z79899 Other long term (current) drug therapy: Secondary | ICD-10-CM | POA: Insufficient documentation

## 2018-02-11 DIAGNOSIS — E039 Hypothyroidism, unspecified: Secondary | ICD-10-CM | POA: Diagnosis not present

## 2018-02-11 DIAGNOSIS — R1310 Dysphagia, unspecified: Secondary | ICD-10-CM | POA: Diagnosis not present

## 2018-02-11 DIAGNOSIS — Z510 Encounter for antineoplastic radiation therapy: Secondary | ICD-10-CM | POA: Diagnosis present

## 2018-02-11 DIAGNOSIS — R590 Localized enlarged lymph nodes: Secondary | ICD-10-CM | POA: Insufficient documentation

## 2018-02-11 DIAGNOSIS — Z5111 Encounter for antineoplastic chemotherapy: Secondary | ICD-10-CM | POA: Insufficient documentation

## 2018-02-11 DIAGNOSIS — I10 Essential (primary) hypertension: Secondary | ICD-10-CM | POA: Insufficient documentation

## 2018-02-11 DIAGNOSIS — C3492 Malignant neoplasm of unspecified part of left bronchus or lung: Secondary | ICD-10-CM

## 2018-02-11 DIAGNOSIS — K208 Other esophagitis: Secondary | ICD-10-CM | POA: Diagnosis not present

## 2018-02-11 DIAGNOSIS — T300 Burn of unspecified body region, unspecified degree: Secondary | ICD-10-CM | POA: Diagnosis not present

## 2018-02-11 LAB — CMP (CANCER CENTER ONLY)
ALK PHOS: 60 U/L (ref 38–126)
ALT: 50 U/L — ABNORMAL HIGH (ref 0–44)
ANION GAP: 9 (ref 5–15)
AST: 25 U/L (ref 15–41)
Albumin: 3.6 g/dL (ref 3.5–5.0)
BUN: 22 mg/dL (ref 8–23)
CALCIUM: 9.3 mg/dL (ref 8.9–10.3)
CHLORIDE: 104 mmol/L (ref 98–111)
CO2: 23 mmol/L (ref 22–32)
Creatinine: 0.95 mg/dL (ref 0.61–1.24)
Glucose, Bld: 203 mg/dL — ABNORMAL HIGH (ref 70–99)
Potassium: 4.2 mmol/L (ref 3.5–5.1)
Sodium: 136 mmol/L (ref 135–145)
Total Bilirubin: 0.4 mg/dL (ref 0.3–1.2)
Total Protein: 6.9 g/dL (ref 6.5–8.1)

## 2018-02-11 LAB — CBC WITH DIFFERENTIAL (CANCER CENTER ONLY)
BASOS ABS: 0 10*3/uL (ref 0.0–0.1)
BASOS PCT: 1 %
Eosinophils Absolute: 0 10*3/uL (ref 0.0–0.5)
Eosinophils Relative: 1 %
HEMATOCRIT: 31.5 % — AB (ref 38.4–49.9)
HEMOGLOBIN: 10.2 g/dL — AB (ref 13.0–17.1)
Lymphocytes Relative: 32 %
Lymphs Abs: 0.7 10*3/uL — ABNORMAL LOW (ref 0.9–3.3)
MCH: 28.1 pg (ref 27.2–33.4)
MCHC: 32.4 g/dL (ref 32.0–36.0)
MCV: 86.8 fL (ref 79.3–98.0)
Monocytes Absolute: 0.1 10*3/uL (ref 0.1–0.9)
Monocytes Relative: 7 %
NEUTROS ABS: 1.3 10*3/uL — AB (ref 1.5–6.5)
NEUTROS PCT: 59 %
Platelet Count: 181 10*3/uL (ref 140–400)
RBC: 3.63 MIL/uL — AB (ref 4.20–5.82)
RDW: 15.5 % — ABNORMAL HIGH (ref 11.0–14.6)
WBC: 2.2 10*3/uL — AB (ref 4.0–10.3)

## 2018-02-11 MED ORDER — DIPHENHYDRAMINE HCL 50 MG/ML IJ SOLN
INTRAMUSCULAR | Status: AC
  Filled 2018-02-11: qty 1

## 2018-02-11 MED ORDER — SODIUM CHLORIDE 0.9 % IV SOLN
45.0000 mg/m2 | Freq: Once | INTRAVENOUS | Status: AC
  Administered 2018-02-11: 96 mg via INTRAVENOUS
  Filled 2018-02-11: qty 16

## 2018-02-11 MED ORDER — DIPHENHYDRAMINE HCL 50 MG/ML IJ SOLN
50.0000 mg | Freq: Once | INTRAMUSCULAR | Status: AC
  Administered 2018-02-11: 50 mg via INTRAVENOUS

## 2018-02-11 MED ORDER — SODIUM CHLORIDE 0.9 % IV SOLN
175.2000 mg | Freq: Once | INTRAVENOUS | Status: AC
  Administered 2018-02-11: 180 mg via INTRAVENOUS
  Filled 2018-02-11: qty 18

## 2018-02-11 MED ORDER — FAMOTIDINE IN NACL 20-0.9 MG/50ML-% IV SOLN
INTRAVENOUS | Status: AC
  Filled 2018-02-11: qty 50

## 2018-02-11 MED ORDER — PALONOSETRON HCL INJECTION 0.25 MG/5ML
0.2500 mg | Freq: Once | INTRAVENOUS | Status: AC
  Administered 2018-02-11: 0.25 mg via INTRAVENOUS

## 2018-02-11 MED ORDER — SODIUM CHLORIDE 0.9 % IV SOLN
Freq: Once | INTRAVENOUS | Status: AC
  Administered 2018-02-11: 11:00:00 via INTRAVENOUS

## 2018-02-11 MED ORDER — DEXAMETHASONE SODIUM PHOSPHATE 100 MG/10ML IJ SOLN
20.0000 mg | Freq: Once | INTRAMUSCULAR | Status: AC
  Administered 2018-02-11: 20 mg via INTRAVENOUS
  Filled 2018-02-11: qty 2

## 2018-02-11 MED ORDER — PALONOSETRON HCL INJECTION 0.25 MG/5ML
INTRAVENOUS | Status: AC
  Filled 2018-02-11: qty 5

## 2018-02-11 MED ORDER — FAMOTIDINE IN NACL 20-0.9 MG/50ML-% IV SOLN
20.0000 mg | Freq: Once | INTRAVENOUS | Status: AC
  Administered 2018-02-11: 20 mg via INTRAVENOUS

## 2018-02-11 NOTE — Telephone Encounter (Signed)
Scheduled appt per 7/1 los - pt to get an updated schedule in treatment area.

## 2018-02-11 NOTE — Progress Notes (Signed)
Oncology Nurse Navigator Documentation  Oncology Nurse Navigator Flowsheets 02/11/2018  Navigator Location [facility]  Navigator Encounter Type Clinic/MDC/I spoke with patient and wife today at the cancer center.  He received a notice from [employer] regarding possible payment.  I helped patient completed financial form with [employer]. I faxed to them as well.    Treatment Phase Treatment  Barriers/Navigation Needs Financial;Coordination of Care  Interventions Coordination of Care;Other  Coordination of Care Other  Acuity Level 2  Time Spent with Patient 45

## 2018-02-11 NOTE — Patient Instructions (Signed)
[facility] Discharge Instructions for Patients Receiving Chemotherapy  Today you received the following chemotherapy agents Taxol, Carboplatin  To help prevent nausea and vomiting after your treatment, we encourage you to take your nausea medication as directed  If you develop nausea and vomiting that is not controlled by your nausea medication, call the clinic.   BELOW ARE SYMPTOMS THAT SHOULD BE REPORTED IMMEDIATELY:  *FEVER GREATER THAN 100.5 F  *CHILLS WITH OR WITHOUT FEVER  NAUSEA AND VOMITING THAT IS NOT CONTROLLED WITH YOUR NAUSEA MEDICATION  *UNUSUAL SHORTNESS OF BREATH  *UNUSUAL BRUISING OR BLEEDING  TENDERNESS IN MOUTH AND THROAT WITH OR WITHOUT PRESENCE OF ULCERS  *URINARY PROBLEMS  *BOWEL PROBLEMS  UNUSUAL RASH Items with * indicate a potential emergency and should be followed up as soon as possible.  Feel free to call the clinic should you have any questions or concerns. The clinic phone number is [phone].  Please show the CHEMO ALERT CARD at check-in to the Emergency Department and triage nurse.   

## 2018-02-11 NOTE — Progress Notes (Signed)
Per kristin curcio NP okay to treat today with anc of 1.3

## 2018-02-12 ENCOUNTER — Encounter (HOSPITAL_COMMUNITY): Payer: Self-pay | Admitting: Internal Medicine

## 2018-02-12 ENCOUNTER — Ambulatory Visit: Payer: PRIVATE HEALTH INSURANCE

## 2018-02-12 ENCOUNTER — Ambulatory Visit: Payer: PRIVATE HEALTH INSURANCE | Admitting: Oncology

## 2018-02-12 ENCOUNTER — Ambulatory Visit
Admission: RE | Admit: 2018-02-12 | Discharge: 2018-02-12 | Disposition: A | Discharge status: Home / Self Care | Payer: Medicare Other | Source: Ambulatory Visit | Attending: Radiation Oncology | Admitting: Radiation Oncology

## 2018-02-12 ENCOUNTER — Other Ambulatory Visit: Payer: PRIVATE HEALTH INSURANCE

## 2018-02-12 DIAGNOSIS — Z510 Encounter for antineoplastic radiation therapy: Secondary | ICD-10-CM | POA: Diagnosis not present

## 2018-02-13 ENCOUNTER — Ambulatory Visit
Admission: RE | Admit: 2018-02-13 | Discharge: 2018-02-13 | Disposition: A | Discharge status: Home / Self Care | Payer: Medicare Other | Source: Ambulatory Visit | Attending: Radiation Oncology | Admitting: Radiation Oncology

## 2018-02-13 DIAGNOSIS — Z510 Encounter for antineoplastic radiation therapy: Secondary | ICD-10-CM | POA: Diagnosis not present

## 2018-02-15 ENCOUNTER — Ambulatory Visit
Admission: RE | Admit: 2018-02-15 | Discharge: 2018-02-15 | Disposition: A | Discharge status: Home / Self Care | Payer: Medicare Other | Source: Ambulatory Visit | Attending: Radiation Oncology | Admitting: Radiation Oncology

## 2018-02-15 DIAGNOSIS — Z510 Encounter for antineoplastic radiation therapy: Secondary | ICD-10-CM | POA: Diagnosis not present

## 2018-02-15 MED ORDER — MAGIC MOUTHWASH W/LIDOCAINE: 10.0000 mL | Freq: Three times a day (TID) | ORAL | 0 refills | Status: DC

## 2018-02-15 MED FILL — MAGIC MW (LID/NYS/MAA/BEN): 7 days supply | Qty: 300 | Fill #0

## 2018-02-18 ENCOUNTER — Inpatient Hospital Stay: Payer: Medicare Other

## 2018-02-18 ENCOUNTER — Encounter: Payer: Self-pay | Admitting: Radiation Oncology

## 2018-02-18 ENCOUNTER — Inpatient Hospital Stay: Payer: Medicare Other | Admitting: Nutrition

## 2018-02-18 ENCOUNTER — Ambulatory Visit
Admission: RE | Admit: 2018-02-18 | Discharge: 2018-02-18 | Disposition: A | Discharge status: Home / Self Care | Payer: Medicare Other | Source: Ambulatory Visit | Attending: Radiation Oncology | Admitting: Radiation Oncology

## 2018-02-18 VITALS — BP 134/66 | HR 93 | Temp 97.9°F | Resp 17

## 2018-02-18 DIAGNOSIS — C3412 Malignant neoplasm of upper lobe, left bronchus or lung: Secondary | ICD-10-CM

## 2018-02-18 DIAGNOSIS — Z5111 Encounter for antineoplastic chemotherapy: Secondary | ICD-10-CM | POA: Diagnosis not present

## 2018-02-18 DIAGNOSIS — Z510 Encounter for antineoplastic radiation therapy: Secondary | ICD-10-CM | POA: Diagnosis not present

## 2018-02-18 DIAGNOSIS — C3492 Malignant neoplasm of unspecified part of left bronchus or lung: Secondary | ICD-10-CM

## 2018-02-18 LAB — CBC WITH DIFFERENTIAL (CANCER CENTER ONLY)
Basophils Absolute: 0 10*3/uL (ref 0.0–0.1)
Basophils Relative: 1 %
EOS ABS: 0 10*3/uL (ref 0.0–0.5)
Eosinophils Relative: 1 %
HEMATOCRIT: 30.5 % — AB (ref 38.4–49.9)
HEMOGLOBIN: 9.8 g/dL — AB (ref 13.0–17.1)
LYMPHS ABS: 0.6 10*3/uL — AB (ref 0.9–3.3)
Lymphocytes Relative: 29 %
MCH: 28.2 pg (ref 27.2–33.4)
MCHC: 32.1 g/dL (ref 32.0–36.0)
MCV: 87.6 fL (ref 79.3–98.0)
MONO ABS: 0.2 10*3/uL (ref 0.1–0.9)
MONOS PCT: 11 %
NEUTROS ABS: 1.2 10*3/uL — AB (ref 1.5–6.5)
NEUTROS PCT: 58 %
Platelet Count: 243 10*3/uL (ref 140–400)
RBC: 3.48 MIL/uL — ABNORMAL LOW (ref 4.20–5.82)
RDW: 16.9 % — AB (ref 11.0–14.6)
WBC Count: 2 10*3/uL — ABNORMAL LOW (ref 4.0–10.3)

## 2018-02-18 LAB — CMP (CANCER CENTER ONLY)
ALBUMIN: 3.6 g/dL (ref 3.5–5.0)
ALK PHOS: 62 U/L (ref 38–126)
ALT: 23 U/L (ref 0–44)
AST: 20 U/L (ref 15–41)
Anion gap: 7 (ref 5–15)
BUN: 12 mg/dL (ref 8–23)
CALCIUM: 9.4 mg/dL (ref 8.9–10.3)
CHLORIDE: 103 mmol/L (ref 98–111)
CO2: 27 mmol/L (ref 22–32)
CREATININE: 0.91 mg/dL (ref 0.61–1.24)
GFR, Est AFR Am: >60 mL/min (ref >60)
GFR, Estimated: >60 mL/min (ref >60)
GLUCOSE: 127 mg/dL — AB (ref 70–99)
Potassium: 4.1 mmol/L (ref 3.5–5.1)
SODIUM: 137 mmol/L (ref 135–145)
Total Bilirubin: 0.5 mg/dL (ref 0.3–1.2)
Total Protein: 7.2 g/dL (ref 6.5–8.1)

## 2018-02-18 MED ORDER — DEXAMETHASONE SODIUM PHOSPHATE 100 MG/10ML IJ SOLN
20.0000 mg | Freq: Once | INTRAMUSCULAR | Status: AC
  Administered 2018-02-18: 20 mg via INTRAVENOUS
  Filled 2018-02-18: qty 2

## 2018-02-18 MED ORDER — SODIUM CHLORIDE 0.9 % IV SOLN
Freq: Once | INTRAVENOUS | Status: AC
  Administered 2018-02-18: 12:00:00 via INTRAVENOUS

## 2018-02-18 MED ORDER — PALONOSETRON HCL INJECTION 0.25 MG/5ML
INTRAVENOUS | Status: AC
  Filled 2018-02-18: qty 5

## 2018-02-18 MED ORDER — CARBOPLATIN CHEMO INJECTION 450 MG/45ML
175.2000 mg | Freq: Once | INTRAVENOUS | Status: AC
  Administered 2018-02-18: 180 mg via INTRAVENOUS
  Filled 2018-02-18: qty 18

## 2018-02-18 MED ORDER — SODIUM CHLORIDE 0.9 % IV SOLN
45.0000 mg/m2 | Freq: Once | INTRAVENOUS | Status: AC
  Administered 2018-02-18: 96 mg via INTRAVENOUS
  Filled 2018-02-18: qty 16

## 2018-02-18 MED ORDER — DIPHENHYDRAMINE HCL 50 MG/ML IJ SOLN
50.0000 mg | Freq: Once | INTRAMUSCULAR | Status: AC
  Administered 2018-02-18: 50 mg via INTRAVENOUS

## 2018-02-18 MED ORDER — PALONOSETRON HCL INJECTION 0.25 MG/5ML
0.2500 mg | Freq: Once | INTRAVENOUS | Status: AC
  Administered 2018-02-18: 0.25 mg via INTRAVENOUS

## 2018-02-18 MED ORDER — FAMOTIDINE IN NACL 20-0.9 MG/50ML-% IV SOLN
20.0000 mg | Freq: Once | INTRAVENOUS | Status: AC
  Administered 2018-02-18: 20 mg via INTRAVENOUS

## 2018-02-18 MED ORDER — DIPHENHYDRAMINE HCL 50 MG/ML IJ SOLN
INTRAMUSCULAR | Status: AC
  Filled 2018-02-18: qty 1

## 2018-02-18 MED ORDER — RADIAPLEXRX EX GEL
Freq: Once | CUTANEOUS | Status: AC
  Administered 2018-02-18: 10:00:00 via TOPICAL

## 2018-02-18 MED ORDER — FAMOTIDINE IN NACL 20-0.9 MG/50ML-% IV SOLN
INTRAVENOUS | Status: AC
  Filled 2018-02-18: qty 50

## 2018-02-18 NOTE — Progress Notes (Signed)
On 01/31/2018 at 1017 I initiated a prior authorization for Hycet by calling [phone]. Prior authorization # [id number].

## 2018-02-18 NOTE — Progress Notes (Signed)
Nutrition follow up with patient and wife during chemotherapy for lung cancer. He continues to experience painful, sore swallowing secondary to XRT. Weight decreased and documented as 181.8 pounds on July 1. Poor appetite continues. Tolerating some liquids and soft solids.  Nutrition Diagnosis: Unintended weight loss continues.  Intervention: Patient educated to increase ONS TID daily. Provided samples. Educated patient on foods which are easier to swallow. Recommended patient take pain medications if it helps ease swallowing pain. Questions answered and teach back method used.  Monitoring, Evaluation, Goals: Increase oral intake to minimize further weight loss.  Next Visit:To be scheduled as needed.

## 2018-02-18 NOTE — Progress Notes (Signed)
Ok to treat with [place] today per MD [facility]

## 2018-02-18 NOTE — Patient Instructions (Signed)
   [facility] Discharge Instructions for Patients Receiving Chemotherapy  Today you received the following chemotherapy agents Taxol and Carboplatin   To help prevent nausea and vomiting after your treatment, we encourage you to take your nausea medication as directed.    If you develop nausea and vomiting that is not controlled by your nausea medication, call the clinic.   BELOW ARE SYMPTOMS THAT SHOULD BE REPORTED IMMEDIATELY:  *FEVER GREATER THAN 100.5 F  *CHILLS WITH OR WITHOUT FEVER  NAUSEA AND VOMITING THAT IS NOT CONTROLLED WITH YOUR NAUSEA MEDICATION  *UNUSUAL SHORTNESS OF BREATH  *UNUSUAL BRUISING OR BLEEDING  TENDERNESS IN MOUTH AND THROAT WITH OR WITHOUT PRESENCE OF ULCERS  *URINARY PROBLEMS  *BOWEL PROBLEMS  UNUSUAL RASH Items with * indicate a potential emergency and should be followed up as soon as possible.  Feel free to call the clinic should you have any questions or concerns. The clinic phone number is [phone].  Please show the CHEMO ALERT CARD at check-in to the Emergency Department and triage nurse.   

## 2018-02-19 ENCOUNTER — Ambulatory Visit
Admission: RE | Admit: 2018-02-19 | Discharge: 2018-02-19 | Disposition: A | Discharge status: Home / Self Care | Payer: Medicare Other | Source: Ambulatory Visit | Attending: Radiation Oncology | Admitting: Radiation Oncology

## 2018-02-19 ENCOUNTER — Other Ambulatory Visit: Payer: PRIVATE HEALTH INSURANCE

## 2018-02-19 ENCOUNTER — Ambulatory Visit: Payer: PRIVATE HEALTH INSURANCE

## 2018-02-19 DIAGNOSIS — Z510 Encounter for antineoplastic radiation therapy: Secondary | ICD-10-CM | POA: Diagnosis not present

## 2018-02-20 ENCOUNTER — Ambulatory Visit
Admission: RE | Admit: 2018-02-20 | Discharge: 2018-02-20 | Disposition: A | Discharge status: Home / Self Care | Payer: Medicare Other | Source: Ambulatory Visit | Attending: Radiation Oncology | Admitting: Radiation Oncology

## 2018-02-20 DIAGNOSIS — Z510 Encounter for antineoplastic radiation therapy: Secondary | ICD-10-CM | POA: Diagnosis not present

## 2018-02-21 ENCOUNTER — Ambulatory Visit
Admission: RE | Admit: 2018-02-21 | Discharge: 2018-02-21 | Disposition: A | Discharge status: Home / Self Care | Payer: Medicare Other | Source: Ambulatory Visit | Attending: Radiation Oncology | Admitting: Radiation Oncology

## 2018-02-21 DIAGNOSIS — Z510 Encounter for antineoplastic radiation therapy: Secondary | ICD-10-CM | POA: Diagnosis not present

## 2018-02-22 ENCOUNTER — Ambulatory Visit: Payer: Medicare Other

## 2018-02-22 ENCOUNTER — Other Ambulatory Visit: Payer: Self-pay | Admitting: *Deleted

## 2018-02-22 DIAGNOSIS — C3412 Malignant neoplasm of upper lobe, left bronchus or lung: Secondary | ICD-10-CM

## 2018-02-25 ENCOUNTER — Inpatient Hospital Stay: Payer: Medicare Other

## 2018-02-25 ENCOUNTER — Ambulatory Visit
Admission: RE | Admit: 2018-02-25 | Discharge: 2018-02-25 | Disposition: A | Discharge status: Home / Self Care | Payer: Medicare Other | Source: Ambulatory Visit | Attending: Radiation Oncology | Admitting: Radiation Oncology

## 2018-02-25 ENCOUNTER — Ambulatory Visit: Payer: Medicare Other

## 2018-02-25 ENCOUNTER — Encounter: Payer: Self-pay | Admitting: Internal Medicine

## 2018-02-25 ENCOUNTER — Telehealth: Payer: Self-pay | Admitting: Internal Medicine

## 2018-02-25 ENCOUNTER — Encounter: Payer: Self-pay | Admitting: *Deleted

## 2018-02-25 ENCOUNTER — Inpatient Hospital Stay (HOSPITAL_BASED_OUTPATIENT_CLINIC_OR_DEPARTMENT_OTHER): Payer: Medicare Other | Admitting: Internal Medicine

## 2018-02-25 VITALS — BP 154/59 | HR 101 | Temp 97.8°F | Resp 18 | Ht 70.0 in | Wt 179.8 lb

## 2018-02-25 DIAGNOSIS — Z8042 Family history of malignant neoplasm of prostate: Secondary | ICD-10-CM

## 2018-02-25 DIAGNOSIS — C3412 Malignant neoplasm of upper lobe, left bronchus or lung: Secondary | ICD-10-CM

## 2018-02-25 DIAGNOSIS — Z79899 Other long term (current) drug therapy: Secondary | ICD-10-CM | POA: Diagnosis not present

## 2018-02-25 DIAGNOSIS — Z510 Encounter for antineoplastic radiation therapy: Secondary | ICD-10-CM | POA: Diagnosis not present

## 2018-02-25 DIAGNOSIS — K208 Other esophagitis without bleeding: Secondary | ICD-10-CM | POA: Insufficient documentation

## 2018-02-25 DIAGNOSIS — T66XXXA Radiation sickness, unspecified, initial encounter: Secondary | ICD-10-CM

## 2018-02-25 DIAGNOSIS — Z5111 Encounter for antineoplastic chemotherapy: Secondary | ICD-10-CM

## 2018-02-25 DIAGNOSIS — T300 Burn of unspecified body region, unspecified degree: Secondary | ICD-10-CM

## 2018-02-25 DIAGNOSIS — C3490 Malignant neoplasm of unspecified part of unspecified bronchus or lung: Secondary | ICD-10-CM

## 2018-02-25 DIAGNOSIS — Z800 Family history of malignant neoplasm of digestive organs: Secondary | ICD-10-CM

## 2018-02-25 LAB — CMP (CANCER CENTER ONLY)
ALK PHOS: 58 U/L (ref 38–126)
ALT: 30 U/L (ref 0–44)
AST: 23 U/L (ref 15–41)
Albumin: 3.2 g/dL — ABNORMAL LOW (ref 3.5–5.0)
Anion gap: 7 (ref 5–15)
BILIRUBIN TOTAL: 0.4 mg/dL (ref 0.3–1.2)
BUN: 15 mg/dL (ref 8–23)
CO2: 26 mmol/L (ref 22–32)
CREATININE: 0.85 mg/dL (ref 0.61–1.24)
Calcium: 9.3 mg/dL (ref 8.9–10.3)
Chloride: 99 mmol/L (ref 98–111)
GFR, Est AFR Am: >60 mL/min (ref >60)
Glucose, Bld: 160 mg/dL — ABNORMAL HIGH (ref 70–99)
Potassium: 4.3 mmol/L (ref 3.5–5.1)
Sodium: 132 mmol/L — ABNORMAL LOW (ref 135–145)
TOTAL PROTEIN: 7.7 g/dL (ref 6.5–8.1)

## 2018-02-25 LAB — CBC WITH DIFFERENTIAL (CANCER CENTER ONLY)
BASOS ABS: 0 10*3/uL (ref 0.0–0.1)
Basophils Relative: 1 %
EOS PCT: 0 %
Eosinophils Absolute: 0 10*3/uL (ref 0.0–0.5)
HCT: 27.2 % — ABNORMAL LOW (ref 38.4–49.9)
Hemoglobin: 9.1 g/dL — ABNORMAL LOW (ref 13.0–17.1)
Lymphocytes Relative: 11 %
Lymphs Abs: 0.4 10*3/uL — ABNORMAL LOW (ref 0.9–3.3)
MCH: 28.9 pg (ref 27.2–33.4)
MCHC: 33.3 g/dL (ref 32.0–36.0)
MCV: 86.6 fL (ref 79.3–98.0)
MONO ABS: 0.5 10*3/uL (ref 0.1–0.9)
Monocytes Relative: 14 %
NEUTROS ABS: 2.7 10*3/uL (ref 1.5–6.5)
NEUTROS PCT: 74 %
PLATELETS: 330 10*3/uL (ref 140–400)
RBC: 3.14 MIL/uL — ABNORMAL LOW (ref 4.20–5.82)
RDW: 18.8 % — ABNORMAL HIGH (ref 11.0–14.6)
WBC Count: 3.6 10*3/uL — ABNORMAL LOW (ref 4.0–10.3)

## 2018-02-25 MED ORDER — MAGIC MOUTHWASH W/LIDOCAINE: 10.0000 mL | Freq: Three times a day (TID) | ORAL | 0 refills | Status: DC

## 2018-02-25 MED ORDER — SONAFINE EX EMUL
1.0000 "application " | Freq: Two times a day (BID) | CUTANEOUS | Status: DC
  Administered 2018-02-25 (×2): 1 via TOPICAL

## 2018-02-25 MED ORDER — HYDROCODONE-ACETAMINOPHEN 7.5-325 MG/15ML PO SOLN: 10.0000 mL | ORAL | 0 refills | Status: DC | PRN

## 2018-02-25 NOTE — Telephone Encounter (Signed)
Gave patient/relative avs report and appointments for August. [facility] radiology will call re scan.

## 2018-02-25 NOTE — Progress Notes (Signed)
Received patient in the nursing clinic following radiation treatment. Hyperpigmentation with desquamation noted upper back. Also, hyperpigmentation without desquamation noted to chest. Patient has been applying radiaplex as directed multiple times per day. Instructed patient to stop Radiaplex and begin using Sonafine instead. Instructed patient about Sonafine use. Patient verbalized understanding.

## 2018-02-25 NOTE — Progress Notes (Signed)
[place] Telephone:([phone]) [phone]   Fax:([phone]) [phone]  OFFICE PROGRESS NOTE  System, Pcp Not In No address on file  DIAGNOSIS: Stage IIIB (T2a, N3, M0)non-small cell lung cancer, squamous cell carcinoma of left lung presented with central left mid lung mass as well as hypermetabolic contralateral mediastinal and hilar lymph nodes and subcarinal lymph nodes diagnosed in April 2019.  Molecular studies:  PD-L1 60% Biomarker Findings Microsatellite status - MS-Stable Tumor Mutational Burden - TMB-Low (0 Muts/Mb) Genomic Findings For a complete list of the genes assayed, please refer to the Appendix. EGFR L858R, amplification CDKN2A/B loss MTAP loss exons 2-8 TT01 splice site 779+3J>Q 7 Disease relevant genes with no reportable alterations: KRAS, RET, ALK, MET, ERBB2, BRAF, ROS1   PRIOR THERAPY: Concurrent chemoradiation with weekly carboplatin for AUC of 2 and paclitaxel 45 mg/M2 started 01/09/2018 status post 7 cycles.  Last dose was given February 18, 2018.  CURRENT THERAPY:  Observation.  INTERVAL HISTORY: Frank Wood 84 y.o. male returns to the clinic today for follow-up visit accompanied by his wife.  The patient is feeling fine today with no specific complaints except for a skin burn from the radiation on the back.  He also has radiation-induced esophagitis with odynophagia.  He is currently on Carafate as well as Magic mouthwash.  He is also on liquid hydrocodone for pain management.  He denied having any chest pain, shortness of breath, cough or hemoptysis.  He denied having any fever or chills.  He has no nausea, vomiting, diarrhea or constipation.  He denied having any significant weight loss or night sweats.  He is expected to complete the course of concurrent radiotherapy on February 27, 2018.  MEDICAL HISTORY: Past Medical History:  Diagnosis Date  . Arthritis   . Diverticulosis   . Glaucoma   . Hypertension   . Hypothyroidism     ALLERGIES:   has No Known Allergies.  MEDICATIONS:  Current Outpatient Medications  Medication Sig Dispense Refill  . aspirin EC 81 MG tablet Take 81 mg by mouth daily.    . benazepril (LOTENSIN) 20 MG tablet Take 20 mg by mouth daily.   1  . gabapentin (NEURONTIN) 100 MG capsule Take 100 mg by mouth daily as needed (pain).     Marland Kitchen gemfibrozil (LOPID) 600 MG tablet Take 600 mg by mouth daily.    Marland Kitchen HYDROcodone-acetaminophen (HYCET) 7.5-325 mg/15 ml solution Take 10-15 mLs by mouth every 4 (four) hours as needed for moderate pain. 473 mL 0  . levothyroxine (SYNTHROID, LEVOTHROID) 150 MCG tablet Take 150 mcg by mouth daily before breakfast.   0  . magic mouthwash w/lidocaine SOLN Take 10 mLs by mouth 4 (four) times daily -  before meals and at bedtime. 300 mL 0  . meloxicam (MOBIC) 15 MG tablet Take 15 mg by mouth daily.    . prochlorperazine (COMPAZINE) 10 MG tablet Take 1 tablet (10 mg total) by mouth every 6 (six) hours as needed for nausea or vomiting. 30 tablet 0  . simethicone (MYLICON) 40 ZE/0.9QZ drops Take 0.6 mLs (40 mg total) by mouth every 6 (six) hours as needed for flatulence. 30 mL 0  . sucralfate (CARAFATE) 1 GM/10ML suspension Take 10 mLs (1 g total) by mouth 4 (four) times daily -  with meals and at bedtime. 420 mL 0  . tamsulosin (FLOMAX) 0.4 MG CAPS capsule Take 0.4 mg by mouth at bedtime.     Marland Kitchen zolpidem (AMBIEN) 10 MG  tablet Take 10 mg by mouth at bedtime as needed for sleep.     No current facility-administered medications for this visit.    Facility-Administered Medications Ordered in Other Visits  Medication Dose Route Frequency Provider Last Rate Last Dose  . SONAFINE emulsion 1 application  1 application Topical BID Hayden Pedro, [place]   1 application at 24/40/10 1103    SURGICAL HISTORY:  Past Surgical History:  Procedure Laterality Date  . BACK SURGERY      REVIEW OF SYSTEMS:  A comprehensive review of systems was negative except for: Constitutional: positive for  fatigue Gastrointestinal: positive for odynophagia Integument/breast: positive for skin lesion(s)   PHYSICAL EXAMINATION: General appearance: alert, cooperative, fatigued and no distress Head: Normocephalic, without obvious abnormality, atraumatic Neck: no adenopathy, no JVD, supple, symmetrical, trachea midline and thyroid not enlarged, symmetric, no tenderness/mass/nodules Lymph nodes: Cervical, supraclavicular, and axillary nodes normal. Resp: clear to auscultation bilaterally Back: symmetric, no curvature. ROM normal. No CVA tenderness. Cardio: regular rate and rhythm, S1, S2 normal, no murmur, click, rub or gallop GI: soft, non-tender; bowel sounds normal; no masses,  no organomegaly Extremities: extremities normal, atraumatic, no cyanosis or edema  ECOG PERFORMANCE STATUS: 1 - Symptomatic but completely ambulatory  Blood pressure (!) 154/59, pulse (!) 101, temperature 97.8 F (36.6 C), temperature source Oral, resp. rate 18, height 5' 10"  (1.778 m), weight 179 lb 12.8 oz (81.6 kg), SpO2 99 %.  LABORATORY DATA: Lab Results  Component Value Date   WBC 2.0 (L) 02/18/2018   HGB 9.8 (L) 02/18/2018   HCT 30.5 (L) 02/18/2018   MCV 87.6 02/18/2018   PLT 243 02/18/2018      Chemistry      Component Value Date/Time   NA 137 02/18/2018 1025   K 4.1 02/18/2018 1025   CL 103 02/18/2018 1025   CO2 27 02/18/2018 1025   BUN 12 02/18/2018 1025   CREATININE 0.91 02/18/2018 1025      Component Value Date/Time   CALCIUM 9.4 02/18/2018 1025   ALKPHOS 62 02/18/2018 1025   AST 20 02/18/2018 1025   ALT 23 02/18/2018 1025   BILITOT 0.5 02/18/2018 1025       RADIOGRAPHIC STUDIES: Dg Chest 2 View  Result Date: 02/05/2018 CLINICAL DATA:  84 year old male with history of lung cancer. Patient had chemo treatment today. Fever. EXAM: CHEST - 2 VIEW COMPARISON:  PET-CT dated 01/03/2018 FINDINGS: There is a 5.2 x 3.4 cm left perihilar density corresponding to the known mass/neoplasm. No new  consolidative changes. There is no pleural effusion or pneumothorax. The cardiac silhouette is within normal limits. No acute osseous pathology. IMPRESSION: Left perihilar mass.  No new consolidative changes. Electronically Signed   By: Anner Crete M.D.   On: 02/05/2018 01:06    ASSESSMENT AND PLAN: This is a very pleasant 84 years old white male with a stage IIIb (T2 a, N3, M0) non-small cell lung cancer diagnosed in April 2019.  The patient is currently undergoing a course of concurrent chemoradiation with weekly carboplatin and paclitaxel status post 7 cycles.  He is expected to complete the last dose of radiation on February 27, 2018. He has been tolerating this treatment well except for the radiation-induced esophagitis as well as skin burn from radiation. He was able to maintain his weight stable during this course. I recommended for the patient to complete the last 2 fraction of his radiation as scheduled. I will see him back for follow-up visit in 3 weeks for  evaluation after repeating CT scan of the chest for restaging of his disease. For the radiation-induced esophagitis, he will continue his current treatment with Carafate and I gave him a refill of hydrocodone liquid pain medication. For the radiation-induced skin lesions, he is currently on treatment with Sonafine emulsion as prescribed by radiation oncology. The patient was advised to call immediately if he has any concerning symptoms in the interval. The patient voices understanding of current disease status and treatment options and is in agreement with the current care plan.  All questions were answered. The patient knows to call the clinic with any problems, questions or concerns. We can certainly see the patient much sooner if necessary.  I spent 10 minutes counseling the patient face to face. The total time spent in the appointment was 15 minutes.  Disclaimer: This note was dictated with voice recognition software. Similar  sounding words can inadvertently be transcribed and may not be corrected upon review.

## 2018-02-26 ENCOUNTER — Ambulatory Visit: Payer: Medicare Other

## 2018-02-26 ENCOUNTER — Ambulatory Visit
Admission: RE | Admit: 2018-02-26 | Discharge: 2018-02-26 | Disposition: A | Discharge status: Home / Self Care | Payer: Medicare Other | Source: Ambulatory Visit | Attending: Radiation Oncology | Admitting: Radiation Oncology

## 2018-02-26 DIAGNOSIS — Z510 Encounter for antineoplastic radiation therapy: Secondary | ICD-10-CM | POA: Diagnosis not present

## 2018-02-27 ENCOUNTER — Ambulatory Visit
Admission: RE | Admit: 2018-02-27 | Discharge: 2018-02-27 | Disposition: A | Discharge status: Home / Self Care | Payer: Medicare Other | Source: Ambulatory Visit | Attending: Radiation Oncology | Admitting: Radiation Oncology

## 2018-02-27 ENCOUNTER — Encounter: Payer: Self-pay | Admitting: Radiation Oncology

## 2018-02-27 DIAGNOSIS — Z510 Encounter for antineoplastic radiation therapy: Secondary | ICD-10-CM | POA: Diagnosis not present

## 2018-02-27 NOTE — Progress Notes (Signed)
  Radiation Oncology         [phone]) [phone] ________________________________  Name: Frank Wood [facility] MRN: [id number]  Date: 02/27/2018  DOB: 1933/08/02   End of Treatment Note  Diagnosis:   84 y.o. male with stage IIIB, cT2bN3Mx, squamous cell carcinoma of the left upper lobe     Indication for treatment:  Curative, Chemo-Radiotherapy       Radiation treatment dates:   01/09/2018 - 02/27/2018  Site/dose:   The primary LUL tumor and involved mediastinal adenopathy were treated to 66 Gy in 33 fractions of 2 Gy.  Beams/energy:   A five field 3D conformal treatment arrangement was used delivering 6 and 10 MV photons.  Daily image-guidance CT was used to align the treatment with the targeted volume.  Narrative: The patient tolerated radiation treatment relatively well.  He experienced some esophagitis characterized as moderate, with symptoms of dry cough, dysphasia, and odynophagia. He reports using Carafate and magic mouthwash with lidocaine to manage esophagitis. He was able to maintain a relatively stable weight over the course of treatment. The patient also noted fatigue and mild shortness of breath with exertion. He did develop some radiation dermatitis with an area of moist desquamation on his back. We discussed wound care.  Plan: The patient has completed radiation treatment. The patient will return to radiation oncology clinic for routine followup in one month. I advised him to call or return sooner if he has any questions or concerns related to his recovery or treatment.  ________________________________  Sheral Apley. Tammi Klippel, M.D.  This document serves as a record of services personally performed by Tyler Pita, MD. It was created on his behalf by Rae Lips, a trained medical scribe. The creation of this record is based on the scribe's personal observations and the provider's statements to them. This document has been checked and approved by the attending provider.

## 2018-03-06 ENCOUNTER — Telehealth: Payer: Self-pay | Admitting: *Deleted

## 2018-03-06 NOTE — Telephone Encounter (Signed)
CALLED PATIENT TO ASK ABOUT RESCHEDULING FU APPT. FROM 03-20-18 TO 03-21-18 @ 2 PM , PT. AGREED TO NEW DATE AND TIME

## 2018-03-19 ENCOUNTER — Telehealth: Payer: Self-pay | Admitting: Medical

## 2018-03-19 ENCOUNTER — Inpatient Hospital Stay (HOSPITAL_BASED_OUTPATIENT_CLINIC_OR_DEPARTMENT_OTHER): Payer: Medicare Other | Admitting: Medical

## 2018-03-19 ENCOUNTER — Ambulatory Visit (HOSPITAL_COMMUNITY)
Admission: RE | Admit: 2018-03-19 | Discharge: 2018-03-19 | Disposition: A | Discharge status: Home / Self Care | Payer: Medicare Other | Source: Ambulatory Visit | Attending: Internal Medicine | Admitting: Internal Medicine

## 2018-03-19 ENCOUNTER — Inpatient Hospital Stay: Payer: Medicare Other | Attending: Internal Medicine

## 2018-03-19 DIAGNOSIS — I700 Atherosclerosis of aorta: Secondary | ICD-10-CM | POA: Insufficient documentation

## 2018-03-19 DIAGNOSIS — C3412 Malignant neoplasm of upper lobe, left bronchus or lung: Secondary | ICD-10-CM | POA: Insufficient documentation

## 2018-03-19 DIAGNOSIS — B370 Candidal stomatitis: Secondary | ICD-10-CM

## 2018-03-19 DIAGNOSIS — E860 Dehydration: Secondary | ICD-10-CM

## 2018-03-19 DIAGNOSIS — R590 Localized enlarged lymph nodes: Secondary | ICD-10-CM | POA: Diagnosis not present

## 2018-03-19 DIAGNOSIS — Z9221 Personal history of antineoplastic chemotherapy: Secondary | ICD-10-CM | POA: Insufficient documentation

## 2018-03-19 DIAGNOSIS — Y842 Radiological procedure and radiotherapy as the cause of abnormal reaction of the patient, or of later complication, without mention of misadventure at the time of the procedure: Secondary | ICD-10-CM | POA: Insufficient documentation

## 2018-03-19 DIAGNOSIS — K210 Gastro-esophageal reflux disease with esophagitis: Secondary | ICD-10-CM | POA: Insufficient documentation

## 2018-03-19 DIAGNOSIS — T300 Burn of unspecified body region, unspecified degree: Secondary | ICD-10-CM | POA: Insufficient documentation

## 2018-03-19 DIAGNOSIS — R918 Other nonspecific abnormal finding of lung field: Secondary | ICD-10-CM | POA: Insufficient documentation

## 2018-03-19 DIAGNOSIS — C3490 Malignant neoplasm of unspecified part of unspecified bronchus or lung: Secondary | ICD-10-CM | POA: Diagnosis present

## 2018-03-19 DIAGNOSIS — Z923 Personal history of irradiation: Secondary | ICD-10-CM | POA: Insufficient documentation

## 2018-03-19 DIAGNOSIS — R05 Cough: Secondary | ICD-10-CM | POA: Insufficient documentation

## 2018-03-19 LAB — CBC WITH DIFFERENTIAL (CANCER CENTER ONLY)
BASOS ABS: 0 10*3/uL (ref 0.0–0.1)
Basophils Relative: 0 %
Eosinophils Absolute: 0.1 10*3/uL (ref 0.0–0.5)
Eosinophils Relative: 1 %
HEMATOCRIT: 27 % — AB (ref 38.4–49.9)
Hemoglobin: 8.7 g/dL — ABNORMAL LOW (ref 13.0–17.1)
Lymphocytes Relative: 7 %
Lymphs Abs: 0.6 10*3/uL — ABNORMAL LOW (ref 0.9–3.3)
MCH: 28.2 pg (ref 27.2–33.4)
MCHC: 32.2 g/dL (ref 32.0–36.0)
MCV: 87.5 fL (ref 79.3–98.0)
MONO ABS: 0.4 10*3/uL (ref 0.1–0.9)
Monocytes Relative: 4 %
NEUTROS ABS: 7.9 10*3/uL — AB (ref 1.5–6.5)
Neutrophils Relative %: 88 %
Platelet Count: 119 10*3/uL — ABNORMAL LOW (ref 140–400)
RBC: 3.08 MIL/uL — ABNORMAL LOW (ref 4.20–5.82)
RDW: 20.4 % — AB (ref 11.0–14.6)
WBC Count: 9 10*3/uL (ref 4.0–10.3)

## 2018-03-19 LAB — CMP (CANCER CENTER ONLY)
ALK PHOS: 70 U/L (ref 38–126)
ALT: 62 U/L — AB (ref 0–44)
AST: 47 U/L — AB (ref 15–41)
Albumin: 2.2 g/dL — ABNORMAL LOW (ref 3.5–5.0)
Anion gap: 12 (ref 5–15)
BILIRUBIN TOTAL: 1.2 mg/dL (ref 0.3–1.2)
BUN: 17 mg/dL (ref 8–23)
CALCIUM: 9 mg/dL (ref 8.9–10.3)
CO2: 24 mmol/L (ref 22–32)
CREATININE: 0.82 mg/dL (ref 0.61–1.24)
Chloride: 99 mmol/L (ref 98–111)
GFR, Est AFR Am: >60 mL/min (ref >60)
GLUCOSE: 137 mg/dL — AB (ref 70–99)
Potassium: 4 mmol/L (ref 3.5–5.1)
Sodium: 135 mmol/L (ref 135–145)
TOTAL PROTEIN: 7.5 g/dL (ref 6.5–8.1)

## 2018-03-19 LAB — RESEARCH LABS

## 2018-03-19 MED ORDER — IOHEXOL 300 MG/ML  SOLN
75.0000 mL | Freq: Once | INTRAMUSCULAR | Status: AC | PRN
  Administered 2018-03-19: 75 mL via INTRAVENOUS

## 2018-03-19 MED ORDER — MAGIC MOUTHWASH W/LIDOCAINE: 10.0000 mL | Freq: Three times a day (TID) | ORAL | 1 refills | Status: DC

## 2018-03-19 MED ORDER — FLUCONAZOLE 100 MG PO TABS
100.0000 mg | ORAL_TABLET | Freq: Every day | ORAL | 0 refills | Status: DC
Start: 2018-03-19 — End: 2023-07-03

## 2018-03-19 NOTE — Telephone Encounter (Signed)
Scheduled appt per 8/6 los - gave patient aVS and calender per los.

## 2018-03-20 ENCOUNTER — Encounter: Payer: Self-pay | Admitting: Internal Medicine

## 2018-03-20 ENCOUNTER — Inpatient Hospital Stay (HOSPITAL_BASED_OUTPATIENT_CLINIC_OR_DEPARTMENT_OTHER): Payer: Medicare Other | Admitting: Internal Medicine

## 2018-03-20 ENCOUNTER — Ambulatory Visit: Payer: Self-pay | Admitting: Urology

## 2018-03-20 ENCOUNTER — Inpatient Hospital Stay: Payer: Medicare Other

## 2018-03-20 VITALS — BP 133/71 | HR 112 | Temp 99.1°F | Wt 178.7 lb

## 2018-03-20 VITALS — BP 133/71 | HR 98 | Temp 99.1°F | Ht 70.0 in | Wt 177.4 lb

## 2018-03-20 DIAGNOSIS — R05 Cough: Secondary | ICD-10-CM

## 2018-03-20 DIAGNOSIS — C3412 Malignant neoplasm of upper lobe, left bronchus or lung: Secondary | ICD-10-CM

## 2018-03-20 DIAGNOSIS — K210 Gastro-esophageal reflux disease with esophagitis: Secondary | ICD-10-CM | POA: Diagnosis not present

## 2018-03-20 DIAGNOSIS — E860 Dehydration: Secondary | ICD-10-CM

## 2018-03-20 DIAGNOSIS — Y842 Radiological procedure and radiotherapy as the cause of abnormal reaction of the patient, or of later complication, without mention of misadventure at the time of the procedure: Secondary | ICD-10-CM | POA: Diagnosis not present

## 2018-03-20 DIAGNOSIS — R590 Localized enlarged lymph nodes: Secondary | ICD-10-CM

## 2018-03-20 DIAGNOSIS — Z923 Personal history of irradiation: Secondary | ICD-10-CM

## 2018-03-20 DIAGNOSIS — R918 Other nonspecific abnormal finding of lung field: Secondary | ICD-10-CM

## 2018-03-20 DIAGNOSIS — T300 Burn of unspecified body region, unspecified degree: Secondary | ICD-10-CM

## 2018-03-20 DIAGNOSIS — Z9221 Personal history of antineoplastic chemotherapy: Secondary | ICD-10-CM

## 2018-03-20 MED ORDER — SODIUM CHLORIDE 0.9 % IV SOLN
Freq: Once | INTRAVENOUS | Status: AC
  Administered 2018-03-20: 11:00:00 via INTRAVENOUS
  Filled 2018-03-20: qty 250

## 2018-03-20 MED ORDER — SODIUM CHLORIDE 0.9 % IV SOLN
INTRAVENOUS | Status: AC
  Filled 2018-03-20: qty 250

## 2018-03-20 MED ORDER — LEVOFLOXACIN 500 MG PO TABS: 500.0000 mg | ORAL_TABLET | Freq: Every day | ORAL | 0 refills | Status: DC

## 2018-03-20 MED ORDER — SODIUM CHLORIDE 0.9% FLUSH
10.0000 mL | Freq: Once | INTRAVENOUS | Status: DC
  Filled 2018-03-20: qty 10

## 2018-03-20 MED ORDER — METHYLPREDNISOLONE 4 MG PO TBPK: ORAL_TABLET | ORAL | 0 refills | Status: DC

## 2018-03-20 MED ORDER — HEPARIN SOD (PORK) LOCK FLUSH 100 UNIT/ML IV SOLN
500.0000 [IU] | Freq: Once | INTRAVENOUS | Status: DC
  Filled 2018-03-20: qty 5

## 2018-03-20 NOTE — Patient Instructions (Signed)
Dehydration, Adult Dehydration is when there is not enough fluid or water in your body. This happens when you lose more fluids than you take in. Dehydration can range from mild to very bad. It should be treated right away to keep it from getting very bad. Symptoms of mild dehydration may include:  Thirst.  Dry lips.  Slightly dry mouth.  Dry, warm skin.  Dizziness. Symptoms of moderate dehydration may include:  Very dry mouth.  Muscle cramps.  Dark pee (urine). Pee may be the color of tea.  Your body making less pee.  Your eyes making fewer tears.  Heartbeat that is uneven or faster than normal (palpitations).  Headache.  Light-headedness, especially when you stand up from sitting.  Fainting (syncope). Symptoms of very bad dehydration may include:  Changes in skin, such as: ? Cold and clammy skin. ? Blotchy (mottled) or pale skin. ? Skin that does not quickly return to normal after being lightly pinched and let go (poor skin turgor).  Changes in body fluids, such as: ? Feeling very thirsty. ? Your eyes making fewer tears. ? Not sweating when body temperature is high, such as in hot weather. ? Your body making very little pee.  Changes in vital signs, such as: ? Weak pulse. ? Pulse that is more than 100 beats a minute when you are sitting still. ? Fast breathing. ? Low blood pressure.  Other changes, such as: ? Sunken eyes. ? Cold hands and feet. ? Confusion. ? Lack of energy (lethargy). ? Trouble waking up from sleep. ? Short-term weight loss. ? Unconsciousness. Follow these instructions at home:  If told by your doctor, drink an ORS: ? Make an ORS by using instructions on the package. ? Start by drinking small amounts, about  cup (120 mL) every 5-10 minutes. ? Slowly drink more until you have had the amount that your doctor said to have.  Drink enough clear fluid to keep your pee clear or pale yellow. If you were told to drink an ORS, finish the ORS  first, then start slowly drinking clear fluids. Drink fluids such as: ? Water. Do not drink only water by itself. Doing that can make the salt (sodium) level in your body get too low (hyponatremia). ? Ice chips. ? Fruit juice that you have added water to (diluted). ? Low-calorie sports drinks.  Avoid: ? Alcohol. ? Drinks that have a lot of sugar. These include high-calorie sports drinks, fruit juice that does not have water added, and soda. ? Caffeine. ? Foods that are greasy or have a lot of fat or sugar.  Take over-the-counter and prescription medicines only as told by your doctor.  Do not take salt tablets. Doing that can make the salt level in your body get too high (hypernatremia).  Eat foods that have minerals (electrolytes). Examples include bananas, oranges, potatoes, tomatoes, and spinach.  Keep all follow-up visits as told by your doctor. This is important. Contact a doctor if:  You have belly (abdominal) pain that: ? Gets worse. ? Stays in one area (localizes).  You have a rash.  You have a stiff neck.  You get angry or annoyed more easily than normal (irritability).  You are more sleepy than normal.  You have a harder time waking up than normal.  You feel: ? Weak. ? Dizzy. ? Very thirsty.  You have peed (urinated) only a small amount of very dark pee during 6-8 hours. Get help right away if:  You have symptoms of   very bad dehydration.  You cannot drink fluids without throwing up (vomiting).  Your symptoms get worse with treatment.  You have a fever.  You have a very bad headache.  You are throwing up or having watery poop (diarrhea) and it: ? Gets worse. ? Does not go away.  You have blood or something green (bile) in your throw-up.  You have blood in your poop (stool). This may cause poop to look black and tarry.  You have not peed in 6-8 hours.  You pass out (faint).  Your heart rate when you are sitting still is more than 100 beats a  minute.  You have trouble breathing. This information is not intended to replace advice given to you by your health care provider. Make sure you discuss any questions you have with your health care provider. Document Released: 05/27/2009 Document Revised: 02/18/2016 Document Reviewed: 09/24/2015 Elsevier Interactive Patient Education  2018 [employer]  

## 2018-03-20 NOTE — Progress Notes (Signed)
[place] [phone]   Fax:([phone]) [phone]  OFFICE PROGRESS NOTE  System, Pcp Not In No address on file  DIAGNOSIS: Stage IIIB (T2a, N3, M0)non-small cell lung cancer, squamous cell carcinoma of left lung presented with central left mid lung mass as well as hypermetabolic contralateral mediastinal and hilar lymph nodes and subcarinal lymph nodes diagnosed in April 2019.  Molecular studies:  PD-L1 60% Biomarker Findings Microsatellite status - MS-Stable Tumor Mutational Burden - TMB-Low (0 Muts/Mb) Genomic Findings For a complete list of the genes assayed, please refer to the Appendix. EGFR L858R, amplification CDKN2A/B loss MTAP loss exons 2-8 BT51 splice site 761+6W>V 7 Disease relevant genes with no reportable alterations: KRAS, RET, ALK, MET, ERBB2, BRAF, ROS1   PRIOR THERAPY: Concurrent chemoradiation with weekly carboplatin for AUC of 2 and paclitaxel 45 mg/M2 started 01/09/2018 status post 7 cycles.  Last dose was given February 18, 2018.  CURRENT THERAPY:  The patient is considered for consolidation immunotherapy with Imfinzi (Durvalumab) 10 mg/KG every 2 weeks.  He preferred to receive this treatment close to home in [facility].  INTERVAL HISTORY: Frank Wood 84 y.o. male returns to the clinic today for follow-up visit accompanied by his wife.  The patient is feeling fine today with no specific complaints except for fatigue as well as cough productive of brownish sputum.  The skin burn from the previous radiation as well as the odynophagia is improving.  He denied having any current chest pain but has shortness of breath with exertion with no hemoptysis.  He denied having any recent weight loss or night sweats.  He has no nausea, vomiting, diarrhea or constipation.  He has no fever or chills.  He had repeat CT scan of the chest performed recently and he is here for evaluation and discussion of his discuss results.  MEDICAL  HISTORY: Past Medical History:  Diagnosis Date  . Arthritis   . Diverticulosis   . Glaucoma   . Hypertension   . Hypothyroidism     ALLERGIES:  has No Known Allergies.  MEDICATIONS:  Current Outpatient Medications  Medication Sig Dispense Refill  . aspirin EC 81 MG tablet Take 81 mg by mouth daily.    . benazepril (LOTENSIN) 20 MG tablet Take 20 mg by mouth daily.   1  . fluconazole (DIFLUCAN) 100 MG tablet Take 1 tablet (100 mg total) by mouth daily. 7 tablet 0  . gabapentin (NEURONTIN) 100 MG capsule Take 100 mg by mouth daily as needed (pain).     Marland Kitchen gemfibrozil (LOPID) 600 MG tablet Take 600 mg by mouth daily.    Marland Kitchen HYDROcodone-acetaminophen (HYCET) 7.5-325 mg/15 ml solution Take 10-15 mLs by mouth every 4 (four) hours as needed for moderate pain. 473 mL 0  . levothyroxine (SYNTHROID, LEVOTHROID) 150 MCG tablet Take 150 mcg by mouth daily before breakfast.   0  . magic mouthwash w/lidocaine SOLN Take 10 mLs by mouth 4 (four) times daily -  before meals and at bedtime. Benadryl, Mylanta, Nystatin, Lidocaine, ingredients ratio: 1:1:1:1 300 mL 1  . simethicone (MYLICON) 40 PX/1.0GY drops Take 0.6 mLs (40 mg total) by mouth every 6 (six) hours as needed for flatulence. 30 mL 0  . sucralfate (CARAFATE) 1 GM/10ML suspension Take 10 mLs (1 g total) by mouth 4 (four) times daily -  with meals and at bedtime. 420 mL 0  . tamsulosin (FLOMAX) 0.4 MG CAPS capsule Take 0.4 mg by mouth at bedtime.     Marland Kitchen  zolpidem (AMBIEN) 10 MG tablet Take 10 mg by mouth at bedtime as needed for sleep.    . meloxicam (MOBIC) 15 MG tablet Take 15 mg by mouth daily.    . prochlorperazine (COMPAZINE) 10 MG tablet Take 1 tablet (10 mg total) by mouth every 6 (six) hours as needed for nausea or vomiting. (Patient not taking: Reported on 03/19/2018) 30 tablet 0   No current facility-administered medications for this visit.     SURGICAL HISTORY:  Past Surgical History:  Procedure Laterality Date  . BACK SURGERY       REVIEW OF SYSTEMS:  Constitutional: positive for fatigue Eyes: negative Ears, nose, mouth, throat, and face: negative Respiratory: positive for cough, dyspnea on exertion and sputum Cardiovascular: negative Gastrointestinal: negative Genitourinary:negative Integument/breast: negative Hematologic/lymphatic: negative Musculoskeletal:negative Neurological: negative Behavioral/Psych: negative Endocrine: negative Allergic/Immunologic: negative   PHYSICAL EXAMINATION: General appearance: alert, cooperative, fatigued and no distress Head: Normocephalic, without obvious abnormality, atraumatic Neck: no adenopathy, no JVD, supple, symmetrical, trachea midline and thyroid not enlarged, symmetric, no tenderness/mass/nodules Lymph nodes: Cervical, supraclavicular, and axillary nodes normal. Resp: rhonchi LUL Back: symmetric, no curvature. ROM normal. No CVA tenderness. Cardio: regular rate and rhythm, S1, S2 normal, no murmur, click, rub or gallop GI: soft, non-tender; bowel sounds normal; no masses,  no organomegaly Extremities: extremities normal, atraumatic, no cyanosis or edema Neurologic: Alert and oriented X 3, normal strength and tone. Normal symmetric reflexes. Normal coordination and gait  ECOG PERFORMANCE STATUS: 1 - Symptomatic but completely ambulatory  Blood pressure 133/71, pulse (!) 112, temperature 99.1 F (37.3 C), temperature source Oral, weight 178 lb 11.2 oz (81.1 kg), SpO2 98 %.  LABORATORY DATA: Lab Results  Component Value Date   WBC 9.0 03/19/2018   HGB 8.7 (L) 03/19/2018   HCT 27.0 (L) 03/19/2018   MCV 87.5 03/19/2018   PLT 119 (L) 03/19/2018      Chemistry      Component Value Date/Time   NA 135 03/19/2018 1316   K 4.0 03/19/2018 1316   CL 99 03/19/2018 1316   CO2 24 03/19/2018 1316   BUN 17 03/19/2018 1316   CREATININE 0.82 03/19/2018 1316      Component Value Date/Time   CALCIUM 9.0 03/19/2018 1316   ALKPHOS 70 03/19/2018 1316   AST 47 (H)  03/19/2018 1316   ALT 62 (H) 03/19/2018 1316   BILITOT 1.2 03/19/2018 1316       RADIOGRAPHIC STUDIES: Ct Chest W Contrast  Result Date: 03/20/2018 CLINICAL DATA:  Left-sided lung cancer. EXAM: CT CHEST WITH CONTRAST TECHNIQUE: Multidetector CT imaging of the chest was performed during intravenous contrast administration. CONTRAST:  13m OMNIPAQUE IOHEXOL 300 MG/ML  SOLN COMPARISON:  PET-CT 01/03/2018.  Chest CT 11/20/2017. FINDINGS: Cardiovascular: The heart size is normal. No substantial pericardial effusion. Coronary artery calcification is evident. Atherosclerotic calcification is noted in the wall of the thoracic aorta. Mediastinum/Nodes: Stable 10 mm short axis subcarinal lymph node. 9 mm short axis right hilar node (2:68) appears similar to 01/03/2018 exam. No right hilar lymphadenopathy. Soft tissue fullness in the left hilum is similar. There is no axillary lymphadenopathy. Lungs/Pleura: The central tracheobronchial airways are patent. Left lung lesion has progressed in the interval and become cavitary since 01/03/2018. This lesion crosses the major fissure and measures approximately 8.4 x 5.9 x 6.8 cm on today's exam. Lesion is contiguous with the posterior pleura, crosses the major fissure, involves the superior left hilum. Innumerable small ground-glass and soft tissue nodules noted on the prior  study are stable. Upper Abdomen: Stable small hypoattenuating liver lesion (2:150). Calcified gallstones again noted. Thickening of both adrenal glands is stable without a discrete nodule or mass. Musculoskeletal: No worrisome lytic or sclerotic osseous abnormality. IMPRESSION: 1. Interval progression of cavitary left lung lesion, extending from the superior left hilum through the major fissure and contiguous with the posterior pleura of the left hemithorax. Lesion now measures approximately 8 x 6 x 7 cm. 2. Stable appearance of borderline enlarged right hilar and mediastinal lymph nodes. 3. Innumerable  tiny pulmonary nodules in the upper lungs bilaterally, stable. 4.  Aortic Atherosclerois (ICD10-170.0) Electronically Signed   By: Misty Stanley M.D.   On: 03/20/2018 10:08    ASSESSMENT AND PLAN: This is a very pleasant 84 years old white male with a stage IIIb (T2 a, N3, M0) non-small cell lung cancer diagnosed in April 2019.  The patient is currently undergoing a course of concurrent chemoradiation with weekly carboplatin and paclitaxel status post 7 cycles.  He is expected to complete the last dose of radiation on February 27, 2018. He has been tolerating this treatment well except for the radiation-induced esophagitis as well as skin burn from radiation. He was able to maintain his weight stable during this course. For the radiation-induced esophagitis, he will continue his current treatment with Carafate. For the radiation-induced skin lesions, he is currently on treatment with Sonafine emulsion as prescribed by radiation oncology. The patient had repeat CT scan of the chest performed recently.  I personally and independently reviewed the scan images and discussed the result and showed the images to the patient and his wife.  His a scan showed enlargement of cavitary left lung lesion extending from the superior left hilum through the major fissure and contagious with the posterior pleura of the left hemithorax.  The patient has a stable borderline enlarged right hilar and mediastinal lymph nodes as well as a stable innumerable tiny bilateral pulmonary nodules in the upper lungs bilaterally. I discussed with the patient several options for management of his condition. His PDL 1 expression is 60% but also the patient has positive EGFR mutation in exon 21 (L858R). I think the enlargement of the left upper lobe cavitary lesion is secondary to radiation effect more than disease progression. I recommended for the patient treatment with consolidation immunotherapy with Imfinzi (Durvalumab) 10 mg/KG every 2  weeks. I discussed with the patient the adverse effect of this treatment. He was also given the option of treatment with targeted therapy with EGFR tyrosine kinase inhibitor if he declines treatment with immunotherapy or if he has any other evidence of disease progression. The patient is interested in proceeding with the immunotherapy but he would prefer to receive his treatment close to home in [facility]. I spoke to Dr. Lerry Liner, medical oncologist at [facility] in [facility] and he kindly accepted the patient to his practice.  We will send medical records to his facility. For the persistent cough productive of brownish sputum, this could be secondary to radiation induced pneumonitis with superimposed inflammatory process, I will start the patient on Medrol Dosepak as well as Levaquin for 7 days. I will see the patient on an as-needed basis at this point but he was also advised to call if I can help in any way. The patient voices understanding of current disease status and treatment options and is in agreement with the current care plan.  All questions were answered. The patient knows to call  the clinic with any problems, questions or concerns. We can certainly see the patient much sooner if necessary.  I spent 15 minutes counseling the patient face to face. The total time spent in the appointment was 25 minutes.  Disclaimer: This note was dictated with voice recognition software. Similar sounding words can inadvertently be transcribed and may not be corrected upon review.

## 2018-03-21 ENCOUNTER — Telehealth: Payer: Self-pay | Admitting: Medical Oncology

## 2018-03-21 ENCOUNTER — Ambulatory Visit: Admission: RE | Admit: 2018-03-21 | Payer: Medicare Other | Source: Ambulatory Visit | Admitting: Urology

## 2018-03-21 NOTE — Progress Notes (Signed)
[facility] Progress Note   Frank Wood [facility] [id number] 1933-07-01 84 y.o.  Frank Wood is managed by Dr. Fanny Bien. Frank Wood  Actively treated with chemotherapy/immunotherapy: no (last treated with cycle 7, day 1 of carboplatin and paclitaxel on 02/18/2018 and last treated with radiation completed on 02/27/2018.)   Assessment: Plan:    Dehydration - Plan: 0.9 %  sodium chloride infusion  Stage III squamous cell carcinoma of left lung (HCC)  Oral candidiasis - Plan: fluconazole (DIFLUCAN) 100 MG tablet, magic mouthwash w/lidocaine SOLN   Dehydration and weakness: Frank Wood will return tomorrow morning for IV fluids prior to his appointment with Dr. Julien Nordmann at 1230.  Stage III squamous cell carcinoma of the left lung: Frank Wood has a restaging CT scan scheduled for this morning.  He will proceed with this study and will follow up with Dr. Julien Nordmann tomorrow 1230.  Oral candidiasis and esophagitis: Patient was given a prescription for Diflucan 100 mg once daily and Magic mouthwash with lidocaine.  Please see After Visit Summary for patient specific instructions.  Future Appointments  Date Time Provider [place]  03/23/2018  2:20 PM GI-315 MR 3 [id number] GI-[facility] [address] [facility]  03/25/2018 11:00 AM Hayden Pedro, PA-C [facility] None    No orders of the defined types were placed in this encounter.      Subjective:   Patient ID:  Frank Wood is a 84 y.o. (DOB Sep 08, 1932) male.  Chief Complaint:  Chief Complaint  Patient presents with  . Fatigue    HPI Frank Wood is an 84 year old male from [place] with a history of a stage III squamous cell carcinoma of the left lung who has been managed by Dr. Fanny Bien. Frank Wood and who was last treated with cycle 7, day 1 of carboplatin and paclitaxel on 02/18/2018 and last treated with radiation completed on 02/27/2018.  He is scheduled to have restaging CT scans completed  tomorrow.  He presents to the office today with his wife.  They report that he has anorexia and is not eating much.  He has been eating soups, putting, and scrambled eggs.  He completed 35/35 fractions of radiation therapy on 02/27/2018 with concurrent chemotherapy completed on 02/18/2018.  He is having difficulty sleeping at night.  He reports fatigue, increasing weakness, epigastric pain, GERD, cough with brownish sputum, and difficulty walking.  He had a subjective fever a few days ago.  He fell at home this morning when he was up to go to the bathroom.  He had no injury.  We discussed that he likely needs IV fluids however he has a restaging CT scan scheduled for this morning but is agreeable to return tomorrow for IV fluids.   Medications: I have reviewed the patient's current medications.  Allergies: No Known Allergies  Past Medical History:  Diagnosis Date  . Arthritis   . Diverticulosis   . Glaucoma   . Hypertension   . Hypothyroidism     Past Surgical History:  Procedure Laterality Date  . BACK SURGERY      Family History  Problem Relation Age of Onset  . Diabetes Mother   . Diabetes Sister   . Prostate cancer Brother   . Colon cancer Neg Hx   . Stomach cancer Neg Hx   . Esophageal cancer Neg Hx     Social History   Socioeconomic History  . Marital status: Married    Spouse name: Not on file  . Number  of children: Not on file  . Years of education: Not on file  . Highest education level: Not on file  Occupational History  . Not on file  Social Needs  . Financial resource strain: Not on file  . Food insecurity:    Worry: Not on file    Inability: Not on file  . Transportation needs:    Medical: Not on file    Non-medical: Not on file  Tobacco Use  . Smoking status: Never Smoker  . Smokeless tobacco: Never Used  Substance and Sexual Activity  . Alcohol use: No  . Drug use: No  . Sexual activity: Not on file  Lifestyle  . Physical activity:    Days per  week: Not on file    Minutes per session: Not on file  . Stress: Not on file  Relationships  . Social connections:    Talks on phone: Not on file    Gets together: Not on file    Attends religious service: Not on file    Active member of club or organization: Not on file    Attends meetings of clubs or organizations: Not on file    Relationship status: Not on file  . Intimate partner violence:    Fear of current or ex partner: Not on file    Emotionally abused: Not on file    Physically abused: Not on file    Forced sexual activity: Not on file  Other Topics Concern  . Not on file  Social History Narrative  . Not on file    Past Medical History, Surgical history, Social history, and Family history were reviewed and updated as appropriate.   Please see review of systems for further details on the patient's review from today.   Review of Systems:  Review of Systems  Constitutional: Positive for appetite change, fatigue, fever and unexpected weight change. Negative for chills and diaphoresis.  HENT: Positive for trouble swallowing.   Respiratory: Positive for cough (Brownish sputum). Negative for chest tightness and shortness of breath.   Cardiovascular: Negative for chest pain and palpitations.  Gastrointestinal: Positive for abdominal pain (Epigastric pain). Negative for constipation, diarrhea, nausea and vomiting.       GERD  Neurological: Positive for weakness. Negative for dizziness and headaches.  Psychiatric/Behavioral: Positive for sleep disturbance.    Objective:   Physical Exam:  There were no vitals taken for this visit. ECOG: 1  Physical Exam  Constitutional: No distress.  The patient is an elderly chronically ill-appearing male who appears to be in no acute distress.  HENT:  Head: Normocephalic.  Thick brownish-white plaquing is noted over the posterior tongue.  Eyes: Right eye exhibits no discharge. Left eye exhibits no discharge. No scleral icterus.    Neck: Normal range of motion. Neck supple.  Cardiovascular: Normal rate, regular rhythm and normal heart sounds. Exam reveals no gallop and no friction rub.  No murmur heard. Pulmonary/Chest: Effort normal and breath sounds normal. No stridor. No respiratory distress. He has no wheezes. He has no rales. He exhibits no tenderness.  Lymphadenopathy:    He has no cervical adenopathy.  Neurological: He is alert. Coordination (The patient is ablating with the use of a wheelchair.) abnormal.  Skin: Skin is warm and dry. He is not diaphoretic. There is pallor.    Lab Review:     Component Value Date/Time   NA 135 03/19/2018 1316   K 4.0 03/19/2018 1316   CL 99 03/19/2018 1316  CO2 24 03/19/2018 1316   GLUCOSE 137 (H) 03/19/2018 1316   BUN 17 03/19/2018 1316   CREATININE 0.82 03/19/2018 1316   CALCIUM 9.0 03/19/2018 1316   PROT 7.5 03/19/2018 1316   ALBUMIN 2.2 (L) 03/19/2018 1316   AST 47 (H) 03/19/2018 1316   ALT 62 (H) 03/19/2018 1316   ALKPHOS 70 03/19/2018 1316   BILITOT 1.2 03/19/2018 1316   GFRNONAA >60 03/19/2018 1316   GFRAA >60 03/19/2018 1316       Component Value Date/Time   WBC 9.0 03/19/2018 1316   WBC 1.8 (L) 02/07/2018 0416   RBC 3.08 (L) 03/19/2018 1316   HGB 8.7 (L) 03/19/2018 1316   HCT 27.0 (L) 03/19/2018 1316   PLT 119 (L) 03/19/2018 1316   MCV 87.5 03/19/2018 1316   MCH 28.2 03/19/2018 1316   MCHC 32.2 03/19/2018 1316   RDW 20.4 (H) 03/19/2018 1316   LYMPHSABS 0.6 (L) 03/19/2018 1316   MONOABS 0.4 03/19/2018 1316   EOSABS 0.1 03/19/2018 1316   BASOSABS 0.0 03/19/2018 1316   -------------------------------  Imaging from last 24 hours (if applicable):  Radiology interpretation: Ct Chest W Contrast  Result Date: 03/20/2018 CLINICAL DATA:  Left-sided lung cancer. EXAM: CT CHEST WITH CONTRAST TECHNIQUE: Multidetector CT imaging of the chest was performed during intravenous contrast administration. CONTRAST:  4mL OMNIPAQUE IOHEXOL 300 MG/ML  SOLN  COMPARISON:  PET-CT 01/03/2018.  Chest CT 11/20/2017. FINDINGS: Cardiovascular: The heart size is normal. No substantial pericardial effusion. Coronary artery calcification is evident. Atherosclerotic calcification is noted in the wall of the thoracic aorta. Mediastinum/Nodes: Stable 10 mm short axis subcarinal lymph node. 9 mm short axis right hilar node (2:68) appears similar to 01/03/2018 exam. No right hilar lymphadenopathy. Soft tissue fullness in the left hilum is similar. There is no axillary lymphadenopathy. Lungs/Pleura: The central tracheobronchial airways are patent. Left lung lesion has progressed in the interval and become cavitary since 01/03/2018. This lesion crosses the major fissure and measures approximately 8.4 x 5.9 x 6.8 cm on today's exam. Lesion is contiguous with the posterior pleura, crosses the major fissure, involves the superior left hilum. Innumerable small ground-glass and soft tissue nodules noted on the prior study are stable. Upper Abdomen: Stable small hypoattenuating liver lesion (2:150). Calcified gallstones again noted. Thickening of both adrenal glands is stable without a discrete nodule or mass. Musculoskeletal: No worrisome lytic or sclerotic osseous abnormality. IMPRESSION: 1. Interval progression of cavitary left lung lesion, extending from the superior left hilum through the major fissure and contiguous with the posterior pleura of the left hemithorax. Lesion now measures approximately 8 x 6 x 7 cm. 2. Stable appearance of borderline enlarged right hilar and mediastinal lymph nodes. 3. Innumerable tiny pulmonary nodules in the upper lungs bilaterally, stable. 4.  Aortic Atherosclerois (ICD10-170.0) Electronically Signed   By: Misty Stanley M.D.   On: 03/20/2018 10:08

## 2018-03-21 NOTE — Telephone Encounter (Signed)
Records faxed.

## 2018-03-22 ENCOUNTER — Other Ambulatory Visit: Payer: Self-pay | Admitting: Radiation Therapy

## 2018-03-23 ENCOUNTER — Ambulatory Visit
Admission: RE | Admit: 2018-03-23 | Discharge: 2018-03-23 | Disposition: A | Discharge status: Home / Self Care | Payer: Medicare Other | Source: Ambulatory Visit | Attending: Radiation Oncology | Admitting: Radiation Oncology

## 2018-03-23 DIAGNOSIS — C719 Malignant neoplasm of brain, unspecified: Secondary | ICD-10-CM

## 2018-03-23 MED ORDER — GADOBENATE DIMEGLUMINE 529 MG/ML IV SOLN
17.0000 mL | Freq: Once | INTRAVENOUS | Status: AC | PRN
  Administered 2018-03-23: 17 mL via INTRAVENOUS

## 2018-03-25 ENCOUNTER — Ambulatory Visit
Admission: RE | Admit: 2018-03-25 | Discharge: 2018-03-25 | Disposition: A | Discharge status: Home / Self Care | Payer: Medicare Other | Source: Ambulatory Visit | Attending: Radiation Oncology | Admitting: Radiation Oncology

## 2018-03-25 ENCOUNTER — Inpatient Hospital Stay: Payer: Medicare Other

## 2018-03-25 VITALS — BP 133/70 | HR 107 | Temp 97.7°F | Resp 18 | Ht 70.0 in | Wt 172.0 lb

## 2018-03-25 DIAGNOSIS — Z7982 Long term (current) use of aspirin: Secondary | ICD-10-CM | POA: Insufficient documentation

## 2018-03-25 DIAGNOSIS — D329 Benign neoplasm of meninges, unspecified: Secondary | ICD-10-CM | POA: Diagnosis not present

## 2018-03-25 DIAGNOSIS — N401 Enlarged prostate with lower urinary tract symptoms: Secondary | ICD-10-CM | POA: Diagnosis not present

## 2018-03-25 DIAGNOSIS — G4700 Insomnia, unspecified: Secondary | ICD-10-CM | POA: Diagnosis not present

## 2018-03-25 DIAGNOSIS — Z79899 Other long term (current) drug therapy: Secondary | ICD-10-CM | POA: Diagnosis not present

## 2018-03-25 DIAGNOSIS — R350 Frequency of micturition: Secondary | ICD-10-CM | POA: Insufficient documentation

## 2018-03-25 DIAGNOSIS — C3412 Malignant neoplasm of upper lobe, left bronchus or lung: Secondary | ICD-10-CM | POA: Insufficient documentation

## 2018-03-25 DIAGNOSIS — Z08 Encounter for follow-up examination after completed treatment for malignant neoplasm: Secondary | ICD-10-CM | POA: Diagnosis not present

## 2018-03-25 MED ORDER — ZOLPIDEM TARTRATE 10 MG PO TABS: 10.0000 mg | ORAL_TABLET | Freq: Every evening | ORAL | 4 refills | Status: DC | PRN

## 2018-03-25 NOTE — Progress Notes (Signed)
Radiation Oncology         [phone] ________________________________  Name: Frank Wood [facility] MRN: [id number]  Date of Service: 03/25/2018 DOB: 01/23/1933  Post Treatment Note  CC: System, Pcp Not In  Danella Sensing, MD  Diagnosis:   Stage IIIB, cT2bN3Mx, squamous cell carcinoma of the left upper lobe     Interval Since Last Radiation:  4 weeks   01/09/2018 - 02/27/2018: The primary LUL tumor and involved mediastinal adenopathy were treated to 66 Gy in 33 fractions of 2 Gy.  Narrative:  The patient returns today for routine follow-up.  Patient tolerated radiotherapy however did have significant esophagitis and deconditioning in the midst of this.                              On review of systems, the patient states he is doing fair.  He continues to have problems with fatigue, and pain when he swallows.  He also notes pain in the esophageal area and describes this as a burning sensation.  This is worsened when laying flat at nighttime.  He continues to use Carafate, hycet suspension, as well as miracle mouthwash and Diflucan which were just started on Thursday of last week.  Despite this, he has been able to eat soft mechanical foods even including toast at times.  His wife describes that he urinates frequently and multiple times during the evening.  He continues to use Flomax.  Apparently he fell last night.  He denies any injuries from this episode.  He denies any shortness of breath or fevers, he does state that he has coughed recently and in his sputum at times he is noticed a small amount of brown mucus. He requests a refill of Ambien.  No other complaints are verbalized.   ALLERGIES:  has No Known Allergies.  Meds: Current Outpatient Medications  Medication Sig Dispense Refill  . aspirin EC 81 MG tablet Take 81 mg by mouth daily.    . benazepril (LOTENSIN) 20 MG tablet Take 20 mg by mouth daily.   1  . fluconazole (DIFLUCAN) 100 MG tablet Take 1 tablet (100 mg total) by mouth  daily. 7 tablet 0  . gabapentin (NEURONTIN) 100 MG capsule Take 100 mg by mouth daily as needed (pain).     Marland Kitchen HYDROcodone-acetaminophen (HYCET) 7.5-325 mg/15 ml solution Take 10-15 mLs by mouth every 4 (four) hours as needed for moderate pain. 473 mL 0  . levothyroxine (SYNTHROID, LEVOTHROID) 150 MCG tablet Take 150 mcg by mouth daily before breakfast.   0  . magic mouthwash w/lidocaine SOLN Take 10 mLs by mouth 4 (four) times daily -  before meals and at bedtime. Benadryl, Mylanta, Nystatin, Lidocaine, ingredients ratio: 1:1:1:1 300 mL 1  . meloxicam (MOBIC) 15 MG tablet Take 15 mg by mouth daily.    . methylPREDNISolone (MEDROL DOSEPAK) 4 MG TBPK tablet Use as instructed 21 tablet 0  . sucralfate (CARAFATE) 1 GM/10ML suspension Take 10 mLs (1 g total) by mouth 4 (four) times daily -  with meals and at bedtime. 420 mL 0  . tamsulosin (FLOMAX) 0.4 MG CAPS capsule Take 0.4 mg by mouth at bedtime.     Marland Kitchen zolpidem (AMBIEN) 10 MG tablet Take 1 tablet (10 mg total) by mouth at bedtime as needed for sleep. 90 tablet 4  . gemfibrozil (LOPID) 600 MG tablet Take 600 mg by mouth daily.    Marland Kitchen levofloxacin (LEVAQUIN) 500 MG  tablet Take 1 tablet (500 mg total) by mouth daily. 7 tablet 0  . prochlorperazine (COMPAZINE) 10 MG tablet Take 1 tablet (10 mg total) by mouth every 6 (six) hours as needed for nausea or vomiting. (Patient not taking: Reported on 03/19/2018) 30 tablet 0  . simethicone (MYLICON) 40 BW/4.6KZ drops Take 0.6 mLs (40 mg total) by mouth every 6 (six) hours as needed for flatulence. 30 mL 0   No current facility-administered medications for this encounter.     Physical Findings:  height is 5\' 10"  (1.778 m) and weight is 172 lb (78 kg). His oral temperature is 97.7 F (36.5 C). His blood pressure is 133/70 and his pulse is 107 (abnormal). His respiration is 18 and oxygen saturation is 99%.  Pain Assessment Pain Score: 0-No pain/10 In general this is a chronically ill appearing Caucasian male in  no acute distress.  He's alert and oriented x4 and appropriate throughout the examination. Cardiopulmonary assessment is negative for acute distress and he exhibits normal effort.  The previously noted dry desquamation on his posterior thorax has improved significantly.  Hyperpigmentation is still present.  Lab Findings: Lab Results  Component Value Date   WBC 9.0 03/19/2018   HGB 8.7 (L) 03/19/2018   HCT 27.0 (L) 03/19/2018   MCV 87.5 03/19/2018   PLT 119 (L) 03/19/2018     Radiographic Findings: Ct Chest W Contrast  Result Date: 03/20/2018 CLINICAL DATA:  Left-sided lung cancer. EXAM: CT CHEST WITH CONTRAST TECHNIQUE: Multidetector CT imaging of the chest was performed during intravenous contrast administration. CONTRAST:  66mL OMNIPAQUE IOHEXOL 300 MG/ML  SOLN COMPARISON:  PET-CT 01/03/2018.  Chest CT 11/20/2017. FINDINGS: Cardiovascular: The heart size is normal. No substantial pericardial effusion. Coronary artery calcification is evident. Atherosclerotic calcification is noted in the wall of the thoracic aorta. Mediastinum/Nodes: Stable 10 mm short axis subcarinal lymph node. 9 mm short axis right hilar node (2:68) appears similar to 01/03/2018 exam. No right hilar lymphadenopathy. Soft tissue fullness in the left hilum is similar. There is no axillary lymphadenopathy. Lungs/Pleura: The central tracheobronchial airways are patent. Left lung lesion has progressed in the interval and become cavitary since 01/03/2018. This lesion crosses the major fissure and measures approximately 8.4 x 5.9 x 6.8 cm on today's exam. Lesion is contiguous with the posterior pleura, crosses the major fissure, involves the superior left hilum. Innumerable small ground-glass and soft tissue nodules noted on the prior study are stable. Upper Abdomen: Stable small hypoattenuating liver lesion (2:150). Calcified gallstones again noted. Thickening of both adrenal glands is stable without a discrete nodule or mass.  Musculoskeletal: No worrisome lytic or sclerotic osseous abnormality. IMPRESSION: 1. Interval progression of cavitary left lung lesion, extending from the superior left hilum through the major fissure and contiguous with the posterior pleura of the left hemithorax. Lesion now measures approximately 8 x 6 x 7 cm. 2. Stable appearance of borderline enlarged right hilar and mediastinal lymph nodes. 3. Innumerable tiny pulmonary nodules in the upper lungs bilaterally, stable. 4.  Aortic Atherosclerois (ICD10-170.0) Electronically Signed   By: Misty Stanley M.D.   On: 03/20/2018 10:08   Mr Jeri Cos LD Contrast  Result Date: 03/24/2018 CLINICAL DATA:  Suspected meningioma, targeting for SRS EXAM: MRI HEAD WITHOUT AND WITH CONTRAST TECHNIQUE: Multiplanar, multiecho pulse sequences of the brain and surrounding structures were obtained without and with intravenous contrast. CONTRAST:  51mL MULTIHANCE GADOBENATE DIMEGLUMINE 529 MG/ML IV SOLN COMPARISON:  01/03/2018 FINDINGS: Brain: The 8 mm left anterior frontal  dural mass with tail in the right frontal parasagittal 12 mm lesion are both stable and thus consistent with meningioma rather than dural metastasis. No associated mass effect. No new lesion, intra or extra-axial. No infarct, hemorrhage, hydrocephalus, or collection. Remote microhemorrhage in the left cerebellum, also seen previously Vascular: Major flow voids and vascular enhancements are preserved Skull and upper cervical spine: And no evidence of marrow lesion Sinuses/Orbits: Negative IMPRESSION: 1. Two small bifrontal dural masses remain stable and thus consistent with meningioma. 2. No evidence of metastatic disease. Electronically Signed   By: Monte Fantasia M.D.   On: 03/24/2018 07:03    Impression/Plan: 1. Stage IIIB, cT2bN3Mx, squamous cell carcinoma of the left upper lobe.  The patient is still having symptoms due to ongoing side effects from radiotherapy and chemotherapy including fatigue, and  esophagitis.  We discussed ways of optimizing his management of his esophagitis, and I support the use of the Diflucan that he was prescribed last week.  He will also continue Magic mouthwash with lidocaine.  We also discussed adding proton pump inhibitor therapy as his symptoms seem to be worse at nighttime, continuing Carafate, and as needed Mylanta.  We also discussed some of the other symptomatic management can include swallowing a tablespoon of olive oil or coconut oil to try and also help ease his swallowing particularly when taking medication.  He is planning to follow-up closer to home with Dr. Durene Romans in [place], [place] who I had the pleasure of working with in the past with other patient care.  I did copy out the results from his previous imaging and molecular profiles of his pathology.  The patient will be seen by [place] on an as-needed basis moving forward.    2. Meningioma.  The patient's MRI scan was reviewed and brain oncology conference this morning.  No evidence of progression was seen, and it was recommended that this be followed routinely on an annual basis or more frequently if he became symptomatic however at this time there does not appear to be a role for radiotherapy or surgical intervention. 3. Deconditioning.  Although this is multifactorial, we discussed the importance of an evaluation with physical therapy.  As he is being seen tomorrow in medical oncology I think this makes sense for this to be coordinated closer to home as I am unaware of the best home health care agencies where he lives. 4. Urinary frequency.  The patient does have benign prostatic hypertrophy, and is relied on Flomax for years.  We discussed reestablishing with urology to discuss whether he would benefit from a TURP. 5. Insomnia.  The patient requests a refill of his Ambien which she is also taken for years this was sent to his pharmacy at home.  We would be happy to refill short-term medications until he  gets fully established back home.    Carola Rhine, PAC

## 2018-03-26 ENCOUNTER — Encounter: Payer: Self-pay | Admitting: Radiation Oncology

## 2018-03-26 ENCOUNTER — Telehealth: Payer: Self-pay | Admitting: Medical Oncology

## 2018-03-26 NOTE — Progress Notes (Signed)
Brain and Spine Tumor Board Documentation  [facility] was presented by Cecil Cobbs, MD at Brain and Spine Tumor Board on 03/26/2018, which included representatives from medical oncology, neuro oncology, radiation oncology, surgical oncology, radiology, pathology, navigation, genetics.  Frank Wood was presented as a current patient with history of the following treatments: adjuvant radiation, adjuvant chemotherapy.  Additionally, we reviewed previous medical and familial history, history of present illness, and recent lab results along with all available histopathologic and imaging studies. The tumor board considered available treatment options and made the following recommendations:  active surveillance  Tumor board is a meeting of clinicians from various specialty areas who evaluate and discuss patients for whom a multidisciplinary approach is being considered. Final determinations in the plan of care are those of the provider(s). The responsibility for follow up of recommendations given during tumor board is that of the provider.   Today's extended care, comprehensive team conference, Frank Wood was not present for the discussion and was not examined.

## 2018-03-26 NOTE — Telephone Encounter (Signed)
LVM to take pt to nearest ED to evaluate pt symptoms of weakness and ? Need for blood. He has been referred to cancer center near his home town.

## 2018-03-26 NOTE — Addendum Note (Signed)
Encounter addended by: Malena Edman, RN on: 03/26/2018 10:46 AM  Actions taken: Medication List reviewed, Problem List reviewed, Allergies reviewed

## 2018-03-27 ENCOUNTER — Ambulatory Visit: Payer: Medicare Other | Admitting: Urology

## 2018-03-27 ENCOUNTER — Telehealth: Payer: Self-pay | Admitting: *Deleted

## 2018-03-27 NOTE — Telephone Encounter (Signed)
Patient's consult note was faxed to Dr. Durene Romans @ [phone]

## 2018-05-15 ENCOUNTER — Telehealth: Payer: Self-pay | Admitting: Internal Medicine

## 2018-05-15 NOTE — Telephone Encounter (Signed)
Faxed medical records to [place], [place] with [facility] @ [phone].  Release ID #[id number]

## 2019-06-29 IMAGING — PT NM PET TUM IMG INITIAL (PI) SKULL BASE T - THIGH
1 of 8 series · 2 of 25 positions shown · non-contrast
Comparison: CT chest 11/20/2017

CLINICAL DATA: Initial treatment strategy for Lung cancer.

EXAM:
NUCLEAR MEDICINE PET SKULL BASE TO THIGH
TECHNIQUE: 9.9 mCi F-18 FDG was injected intravenously. Full-ring PET imaging
was performed from the skull base to thigh after the radiotracer. CT
data was obtained and used for attenuation correction and anatomic
localization.
Fasting blood glucose: 128 mg/dl

[Series 4: ct sk_thigh 5.0 b31f · axial · 5.0mm · 0.98mm/px · z∈[-1026,-82]mm · 2 of 237 slices shown]
[im 1/237  brain]
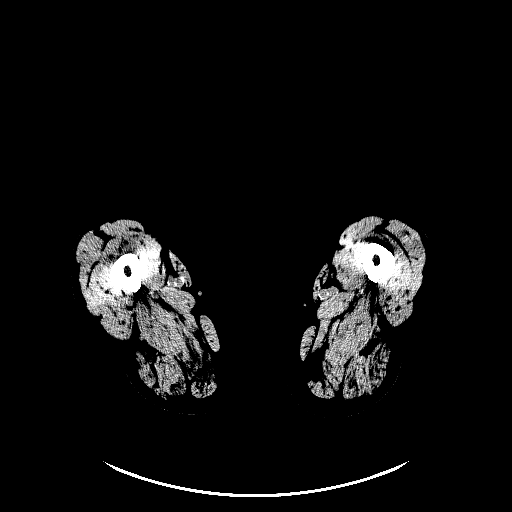
[im 237/237  brain]
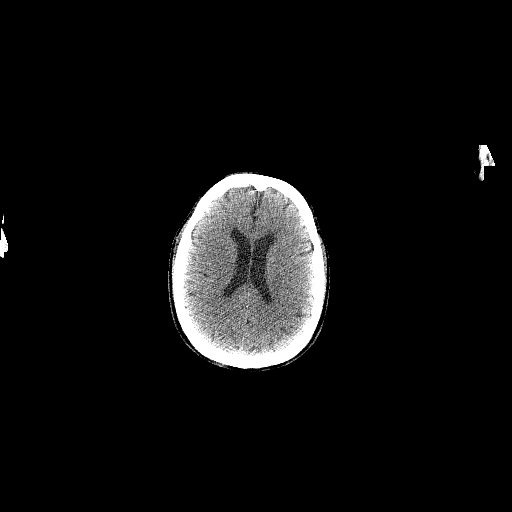

[2 of 25 positions shown; findings below may reference images not displayed]

FINDINGS: Mediastinal blood pool activity: SUV max

NECK: No hypermetabolic lymph nodes in the neck.

Incidental CT findings: none

CHEST: The central left mid lung mass centered around the fissure
measures 5.2 cm and has an SUV max 14.4. Scattered tiny nodules are
identified throughout both lungs. These measure up to 4 mm, image
[DATE] and are too small to reliably characterize.

Subcentimeter anterior mediastinal prevascular node measures 0.7 cm
and has an SUV max equal to 6.19. Hypermetabolic subcarinal lymph
node has an SUV max of 10.8. Hypermetabolic right hilar lymph node
has an SUV max of 6.95. No hypermetabolic supraclavicular or
axillary lymph nodes. No pleural effusion.

Incidental CT findings: Aortic atherosclerosis. Calcification in the
LAD, left circumflex and RCA coronary artery identified. Calcified
granuloma identified in the left lower lobe.

ABDOMEN/PELVIS: No abnormal hypermetabolic activity within the
liver, pancreas, adrenal glands, or spleen. No hypermetabolic lymph
nodes in the abdomen or pelvis.

Incidental CT findings: Aortic atherosclerosis without aneurysm.
cm cyst arises from inferior pole of right kidney. Small gallstones
are identified.

SKELETON: No focal hypermetabolic activity to suggest skeletal
metastasis.

Incidental CT findings: none
IMPRESSION: 1. Central left midlung perifissural mass is markedly hypermetabolic
compatible with primary bronchogenic carcinoma.
2. Hypermetabolic contralateral mediastinal and hilar lymph nodes
and subcarinal lymph node compatible with metastatic adenopathy.
3. Numerous tiny pulmonary nodules are scattered throughout both
lungs and are too small to characterize by PET-CT measuring up to 4
mm. These have a nonspecific appearance. Differential considerations
include pulmonary metastasis versus chronic inflammatory/infectious
process. Granulomatous disease not excluded.
4. Aortic atherosclerosis with 3 vessel coronary artery
atherosclerotic calcifications. Aortic Atherosclerosis
([id number]).

## 2019-08-15 DEATH — deceased
